# Patient Record
Sex: Female | Born: 1990 | Race: Black or African American | Hispanic: No | Marital: Single | Smoking: Never smoker
Health system: Southern US, Community
[De-identification: ages and names within clinical notes are randomized; demographics above are authoritative.]

## PROBLEM LIST (undated history)

## (undated) ENCOUNTER — Inpatient Hospital Stay (HOSPITAL_COMMUNITY): Payer: Self-pay

## (undated) DIAGNOSIS — J45909 Unspecified asthma, uncomplicated: Secondary | ICD-10-CM

## (undated) DIAGNOSIS — I10 Essential (primary) hypertension: Secondary | ICD-10-CM

## (undated) DIAGNOSIS — E7800 Pure hypercholesterolemia, unspecified: Secondary | ICD-10-CM

## (undated) DIAGNOSIS — O021 Missed abortion: Secondary | ICD-10-CM

## (undated) DIAGNOSIS — E119 Type 2 diabetes mellitus without complications: Secondary | ICD-10-CM

## (undated) DIAGNOSIS — E8881 Metabolic syndrome: Secondary | ICD-10-CM

## (undated) DIAGNOSIS — E282 Polycystic ovarian syndrome: Secondary | ICD-10-CM

## (undated) DIAGNOSIS — E88819 Insulin resistance, unspecified: Secondary | ICD-10-CM

## (undated) DIAGNOSIS — I82409 Acute embolism and thrombosis of unspecified deep veins of unspecified lower extremity: Secondary | ICD-10-CM

## (undated) DIAGNOSIS — I499 Cardiac arrhythmia, unspecified: Secondary | ICD-10-CM

## (undated) DIAGNOSIS — I2699 Other pulmonary embolism without acute cor pulmonale: Secondary | ICD-10-CM

## (undated) HISTORY — PX: NO PAST SURGERIES: SHX2092

## (undated) HISTORY — PX: WISDOM TOOTH EXTRACTION: SHX21

## (undated) HISTORY — DX: Metabolic syndrome: E88.81

## (undated) HISTORY — DX: Insulin resistance, unspecified: E88.819

## (undated) HISTORY — DX: Missed abortion: O02.1

## (undated) HISTORY — DX: Type 2 diabetes mellitus without complications: E11.9

## (undated) HISTORY — DX: Essential (primary) hypertension: I10

## (undated) HISTORY — DX: Unspecified asthma, uncomplicated: J45.909

## (undated) HISTORY — DX: Pure hypercholesterolemia, unspecified: E78.00

---
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
## 2010-04-13 ENCOUNTER — Emergency Department (HOSPITAL_COMMUNITY): Admission: EM | Admit: 2010-04-13 | Discharge: 2010-04-13 | Payer: Self-pay | Admitting: Emergency Medicine

## 2010-08-05 ENCOUNTER — Encounter: Payer: Self-pay | Admitting: "Endocrinology

## 2012-08-21 ENCOUNTER — Emergency Department
Admission: EM | Admit: 2012-08-21 | Discharge: 2012-08-21 | Disposition: A | Discharge status: Home / Self Care | Payer: BLUE CROSS/BLUE SHIELD | Attending: Emergency Medicine | Admitting: Emergency Medicine

## 2012-08-21 ENCOUNTER — Emergency Department: Payer: BLUE CROSS/BLUE SHIELD

## 2012-08-21 DIAGNOSIS — Y93K9 Activity, other involving animal care: Secondary | ICD-10-CM | POA: Insufficient documentation

## 2012-08-21 DIAGNOSIS — IMO0002 Reserved for concepts with insufficient information to code with codable children: Secondary | ICD-10-CM | POA: Insufficient documentation

## 2012-08-21 DIAGNOSIS — X500XXA Overexertion from strenuous movement or load, initial encounter: Secondary | ICD-10-CM | POA: Insufficient documentation

## 2012-08-21 MED ORDER — IBUPROFEN 400 MG PO TABS
800.0000 mg | ORAL_TABLET | Freq: Once | ORAL | Status: AC
Start: 2012-08-21 — End: 2012-08-21
  Administered 2012-08-21: 800 mg via ORAL
  Filled 2012-08-21: qty 2

## 2012-08-21 NOTE — ED Notes (Signed)
Reviewed discharge information with Erin Sweet LPN, discharge of patient delegated to her.

## 2012-08-21 NOTE — Discharge Instructions (Signed)
Use crutches and immobilizer until follow up with orthopedics.    Sprain, Knee    A sprain is an injury to the ligaments or capsule that holds a joint together. There are no broken bones. Most sprains take three to six weeks to heal. If the ligament is completely torn (severe sprain), it can take months to recover from.  Most knee sprains are treated with a splint, knee immobilizer or elastic wrap for support. Severe sprains may require surgery.  Home care  The following guidelines will help you care for your injury at home:  1. Stay off the injured leg as much as possible until you can walk on it without pain. If you have a lot of pain with walking, crutches or a walker may be prescribed. (These can be rented or purchased at [employer] and surgical or orthopedic supply stores). Follow your doctor's advice regarding when to begin bearing weight on that leg.  2. Keep your leg elevated to reduce pain and swelling. When sleeping, place a pillow under the injured leg. When sitting, support the injured leg so it is level with your waist. This is very important during the first 48 hours.  3. Apply an ice pack (ice cubes in a plastic bag, wrapped in a towel) over the injured area for 20 minutes every 1-2 hours the first day. You can place the ice pack directly over the splint. If a Velcro knee immobilizer was applied, you can open this to apply the ice pack directly to the knee. Continue with ice packs 3-4 times a day for the next two days, then as needed for the relief of pain and swelling.  4. You may use acetaminophen or ibuprofen to control pain, unless another pain medicine was prescribed. If you have chronic liver or kidney disease or ever had a stomach ulcer or GI bleeding, talk with your doctor before using these medicines.  5. If you were given a splint, keep it completely dry at all times. Bathe with your splint out of the water, protected with a large plastic bag, rubber-banded at the top end. If a  fiberglass splint gets wet, you can dry it with a hair-dryer. If you have a Velcro knee immobilizer, you can remove this to bathe, unless told otherwise.  Follow-up care  Follow up with your doctor as advised.  Any X-rays you had today don't show any broken bones, breaks, or fractures. Sometimes fractures don't show up on the first X-ray. Bruises and sprains can sometimes hurt as much as a fracture. These injuries can take time to heal completely. If your symptoms don't improve or they get worse, talk with your doctor. You may need a repeat X-ray.  When to seek medical care  Get prompt medical attention if any of the following occur:   The plaster cast or splint becomes wet or soft   The fiberglass cast or splint remains wet for more than 24 hours   Pain or swelling increases   Toes become cold, blue, numb or tingly   [address] [place], [place] [address]. All rights reserved. This information is not intended as a substitute for professional medical care. Always follow your healthcare professional's instructions.

## 2012-08-21 NOTE — ED Notes (Signed)
Twisted left knee.

## 2012-08-22 NOTE — ED Provider Notes (Signed)
Physician/Midlevel provider first contact with patient: 08/21/12 0901         History     Chief Complaint   Patient presents with   . left knee injury      Patient is a 22 y.o. female presenting with knee pain. The history is provided by the patient.   Knee Pain  This is a new problem. The current episode started today. The problem occurs constantly. The problem has been unchanged. Associated symptoms include arthralgias. Pertinent negatives include no abdominal pain, chest pain, chills, fever, headaches, joint swelling, myalgias, nausea, neck pain, numbness, rash, vomiting or weakness. The symptoms are aggravated by walking. She has tried nothing for the symptoms.   Twisted L knee while working with horse.  Pain with wt bearing and walking on lateral knee.  Denies other pain or injury.    Past Medical History   Diagnosis Date   . Asthma without status asthmaticus        History reviewed. No pertinent past surgical history.    Family History   Problem Relation Age of Onset   . Hyperlipidemia Mother    . Diabetes Maternal Aunt        Social  History   Substance Use Topics   . Smoking status: Never Smoker    . Smokeless tobacco: Not on file   . Alcohol Use: No       .     No Known Allergies    Current/Home Medications    No medications on file        Review of Systems   Constitutional: Negative for fever and chills.   HENT: Negative for neck pain.    Respiratory: Negative for chest tightness and shortness of breath.    Cardiovascular: Negative for chest pain.   Gastrointestinal: Negative for nausea, vomiting and abdominal pain.   Genitourinary: Negative for flank pain.   Musculoskeletal: Positive for arthralgias and gait problem. Negative for myalgias, back pain and joint swelling.   Skin: Negative for color change, pallor, rash and wound.   Neurological: Negative for weakness, numbness and headaches.   Psychiatric/Behavioral: Negative for confusion and self-injury.       Physical Exam    BP 134/81  Pulse 91  Temp  98.4 F (36.9 [name])  Resp 18  Ht 1.803 m  Wt 131.543 kg  BMI 40.46 kg/m2  LMP 08/03/2012    Physical Exam   Nursing note and vitals reviewed.  Constitutional: She is oriented to person, place, and time. She appears well-developed and well-nourished. No distress.   Neck: Normal range of motion. Neck supple.   Cardiovascular: Normal rate and regular rhythm.    Pulmonary/Chest: Effort normal. No respiratory distress.   Musculoskeletal:        Left knee: She exhibits no swelling, no effusion, no deformity, no erythema, normal alignment and normal patellar mobility. tenderness found. Lateral joint line tenderness noted.        Pain with wt bearing, unable to take two steps, full passive ROM, +McMurray varus stress   Neurological: She is alert and oriented to person, place, and time. She exhibits normal muscle tone.   Skin: Skin is warm and dry. No rash noted.   Psychiatric: She has a normal mood and affect. Her behavior is normal. Thought content normal.       MDM and ED Course     ED Medication Orders      Start     Status Ordering Provider  08/21/12 0945   ibuprofen (ADVIL,MOTRIN) tablet 800 mg   Once      Route: Oral  Ordered Dose: 800 mg         Last MAR action:  Given Judith [name]                 MDM  Number of Diagnoses or Management Options  Sprain of left knee:   Diagnosis management comments: I, Jolee Critcher [name]. Kizzie Bane, MD, have been the primary provider for Judith [name] during this Emergency Dept visit.    Knee strain, difficulty with wt bearing.  Knee immobilizer/crutches and f/u ortho for further eval.         Amount and/or Complexity of Data Reviewed  Tests in the radiology section of CPT: ordered and reviewed          Procedures    Clinical Impression & Disposition     Clinical Impression  Final diagnoses:   Sprain of left knee        ED Disposition     Discharge Judith [name] discharge to home/self care.    Condition at discharge: Good             New Prescriptions    No medications on file                Erling Conte, MD  08/22/12 1131

## 2013-08-01 ENCOUNTER — Emergency Department: Payer: BLUE CROSS/BLUE SHIELD

## 2013-08-01 ENCOUNTER — Emergency Department
Admission: EM | Admit: 2013-08-01 | Discharge: 2013-08-01 | Disposition: A | Discharge status: Home / Self Care | Payer: BLUE CROSS/BLUE SHIELD | Attending: Emergency Medicine | Admitting: Emergency Medicine

## 2013-08-01 DIAGNOSIS — S161XXA Strain of muscle, fascia and tendon at neck level, initial encounter: Secondary | ICD-10-CM

## 2013-08-01 DIAGNOSIS — IMO0001 Reserved for inherently not codable concepts without codable children: Secondary | ICD-10-CM

## 2013-08-01 DIAGNOSIS — S0990XA Unspecified injury of head, initial encounter: Secondary | ICD-10-CM | POA: Insufficient documentation

## 2013-08-01 DIAGNOSIS — S139XXA Sprain of joints and ligaments of unspecified parts of neck, initial encounter: Secondary | ICD-10-CM | POA: Insufficient documentation

## 2013-08-01 DIAGNOSIS — I10 Essential (primary) hypertension: Secondary | ICD-10-CM | POA: Insufficient documentation

## 2013-08-01 MED ORDER — ONDANSETRON 4 MG PO TBDP
4.0000 mg | ORAL_TABLET | Freq: Once | ORAL | Status: AC
Start: 2013-08-01 — End: 2013-08-01
  Administered 2013-08-01: 4 mg via ORAL
  Filled 2013-08-01: qty 1

## 2013-08-01 MED ORDER — ACETAMINOPHEN 500 MG PO TABS
1000.0000 mg | ORAL_TABLET | Freq: Once | ORAL | Status: AC
Start: 2013-08-01 — End: 2013-08-01
  Administered 2013-08-01: 1000 mg via ORAL
  Filled 2013-08-01: qty 2

## 2013-08-01 NOTE — ED Provider Notes (Signed)
Physician/Midlevel provider first contact with patient: 08/01/13 1759         History     Chief Complaint   Patient presents with   . Head Injury     HPI Comments: 23 yo female with c/o head injury at 11 am today. Larey Seat off a horse and landed on her right shoulder and then her head hit the ground. Was wearing a helmet. No loc. Got back up and on the horse and continued to ride. Went home and took a nap and when she woke up this evening c/o h/a, nausea and right sided neck pain. Denies dizziness, vision changes, back pain, vomiting or unsteady gait.    Patient is a 23 y.o. female presenting with head injury. The history is provided by the patient.   Head Injury   The incident occurred 6 to 12 hours ago. She came to the ER via walk-in. The injury mechanism was a fall. There was no loss of consciousness. Pertinent negatives include no blurred vision, no vomiting and no weakness. She has tried nothing for the symptoms.       Past Medical History   Diagnosis Date   . Asthma without status asthmaticus    . Hypertension    . Hypercholesteremia        History reviewed. No pertinent past surgical history.    Family History   Problem Relation Age of Onset   . Hyperlipidemia Mother    . Diabetes Maternal Aunt        Social  History   Substance Use Topics   . Smoking status: Never Smoker    . Smokeless tobacco: Not on file   . Alcohol Use: Yes      Comment: social       .     No Known Allergies    Current/Home Medications    No medications on file        Review of Systems   Constitutional: Negative for fever.   Eyes: Negative for blurred vision, discharge, redness and visual disturbance.   Respiratory: Negative for cough and shortness of breath.    Cardiovascular: Negative for chest pain.   Gastrointestinal: Positive for nausea. Negative for vomiting.   Genitourinary: Negative for flank pain.   Musculoskeletal: Positive for neck pain. Negative for back pain.   Skin: Negative for wound.   Neurological: Positive for headaches.  Negative for dizziness, syncope, weakness and light-headedness.   Psychiatric/Behavioral: Negative for confusion.       Physical Exam    BP: 181/98 mmHg, Heart Rate: 88 , Temp: 97.4 F (36.3 C), Resp Rate: 18 , SpO2: 100 %, Weight: 131.543 kg    Physical Exam   Nursing note and vitals reviewed.  Constitutional: She is oriented to person, place, and time. She appears well-developed and well-nourished.   HENT:   Head: Normocephalic and atraumatic.   Right Ear: External ear normal.   Left Ear: External ear normal.   Mouth/Throat: Oropharynx is clear and moist.   Eyes: Conjunctivae normal and EOM are normal. Pupils are equal, round, and reactive to light. Right eye exhibits no discharge. Left eye exhibits no discharge.   Neck: Normal range of motion. Neck supple.        Right cervical paraspinal muscles ttp, no midline ttp, full rom   Cardiovascular: Normal rate and regular rhythm.    Pulmonary/Chest: Effort normal and breath sounds normal. No respiratory distress.   Musculoskeletal: Normal range of motion.  No t-l spine ttp   Neurological: She is alert and oriented to person, place, and time. She has normal strength. She displays a negative Romberg sign. Coordination and gait normal. GCS eye subscore is 4. GCS verbal subscore is 5. GCS motor subscore is 6.   Skin: Skin is warm and dry.   Psychiatric: She has a normal mood and affect. Her behavior is normal.       MDM and ED Course     ED Medication Orders     None           MDM  Number of Diagnoses or Management Options  Cervical strain, initial encounter:   Elevated blood pressure:   Head injury, initial encounter:   Diagnosis management comments: I, Polo Riley PA-C, have been the primary provider for Judith [name] during this Emergency Dept visit.  Oxygen saturation by pulse oximetry is 95%-100%, Normal.  Interventions: None Needed    No loc, no vomiting, no dizziness, no unsteady gait, right sided neck pain, no midline ttp, imaging not warrented. Pt in  agreement.     Recommended immediate er return for any new or worsening problems or concerns. Pt expressed understanding and agreement with [facility] plan. Well appearing upon [place].     The attending signature signifies review and agreement of the history, physical examination, evaluation, clinical impression and plan except as noted  Case d/w Dr.Thomas who is in agreement with plan        Procedures    Clinical Impression & Disposition     Clinical Impression  Final diagnoses:   None        ED Disposition     None           New Prescriptions    No medications on file                 Rondell Reams, [place]  08/01/13 1842

## 2013-08-01 NOTE — ED Notes (Signed)
While on a horse today (wearing a helmet) patient fell off- landing on back and hsoulder then hitting head- no -LOC.jpatient c/o head and neck pain    Patient non complaint with medication for BP and Chol

## 2013-08-01 NOTE — Discharge Instructions (Signed)
Return to the ER immediately for any new or worsening problems or concerns  Have your blood pressure rechecked by your primary care provider in 1-2 weeks      Elevated Blood Pressure    During your visit today your blood pressure was higher than normal.    Check your blood pressure several times over the next several days, then follow up with your regular doctor. If you do not have a doctor, ask the medical staff to refer you to one.    You may need medication for your blood pressure if it stays high. Untreated high blood pressure can cause damage to your heart and kidneys and may lead to a heart attack or stroke. It is VERY IMPORTANT to follow up with your doctor.   Check your blood pressure daily and follow up with your physician.   A doctor will diagnose high blood pressure only if your blood pressure is high for several days. Many pharmacies have machines that let you check your own blood pressure. You can also check with a fire station to see whether a paramedic will take your blood pressure. Another option is to purchase a blood pressure monitor to use at home. These are available at most pharmacies.     YOU SHOULD SEEK MEDICAL ATTENTION IMMEDIATELY, EITHER HERE OR AT THE NEAREST EMERGENCY DEPARTMENT, IF ANY OF THE FOLLOWING OCCURS:   You have a sudden or severe headache.   You are numb, tingly, or weak on one side of your body, half of your face droops, or you have trouble speaking.   You have chest pain.   You are short of breath.      Head Injury, NOS    You have been seen for a head injury.    A head injury can happen after something strikes the head or as a result of a fall or other injury. Head injuries can range from mild injuries to more severe injuries. The more severe injuries can result in broken bones or injury to the brain itself. Mild head injuries will show no abnormalities if a CT (CAT) scan of the brain is done.     Although you had an injury to your head, you do not seem to have  a serious brain injury.     Head injury symptoms can last from hours to months. The time depends on how bad the injury was. It also depends on whether you ve had a concussion in the past. Some problems with a concussion can include: Sleep, memory and concentration problems. They also include chronic (ongoing) headaches and sensitivity to light. These symptoms can happen soon after the concussion. They can also develop slowly over time. They can last up to a year. When this happens, it is called "post concussion syndrome."    If you develop "post-concussive syndrome," you should follow up with your doctor. Your doctor can care for you or provide a referral to a head-injury specialist.    Treatment includes observation at home and pain medicine like acetaminophen (Tylenol) or ibuprofen (Advil or Motrin). Prescription pain medicine is probably not needed.    You might have a mild headache for a few days.    Over the next 24 hours:   Stay with family or friends who can watch your behavior.   Avoid alcohol or drugs.    YOU SHOULD SEEK MEDICAL ATTENTION IMMEDIATELY, EITHER HERE OR AT THE NEAREST EMERGENCY DEPARTMENT, IF ANY OF THE FOLLOWING OCCURS:   Your headache  gets worse.   Your headache pain changes.   You have a fever, neck pain, vision changes, difficulty walking or change of behavior.   You feel numbness, tingling, weakness in your arms or legs.   You faint.   Your vision changes.   You vomit often or cannot keep medicine down.   You are confused or have difficulty waking from sleep.    Cervical Strain    You have been diagnosed with a neck strain, also called a cervical strain.    The cervical spine is between the base of the skull and the top of the shoulders.    A strain happens when a muscle is stretched, torn or injured. The pain that you feel is caused by inflammation (swelling) or bruising in the muscle. A strain is not the same as a sprain. A sprain is an injury to a ligament that  holds bones together.    A cervical strain occurs when the head snaps forward during an accident or a fall. The muscles can easily be strained with this type of movement. It is normal to experience pain over the muscles around the neck but not over the bones of the cervical spine.    Your doctor did not find any pain over the bones in your neck (even though you might have pain in the neck muscles). This means it is very unlikely that you have a fracture in your neck. Your doctor did not think it was necessary to take an x-ray.    Apply a warm damp washcloth to the neck for 20 minutes at a time, at least 4 times per day. This will reduce your pain. Massaging your neck might also help.    It is normal to feel stiffness and pain in your neck after a strain. This pain may last for the next few days. If your pain stays about the same or gets better, you probably do not need to see a doctor. However, if your symptoms get worse or you have new symptoms, you should return here or go to the nearest Emergency Department.    Call your physician or go to the nearest Emergency Department if you your pain does not improve within 4 weeks or your pain is bad enough to seriously limit your normal activities.    YOU SHOULD SEEK MEDICAL ATTENTION IMMEDIATELY, EITHER HERE OR AT THE NEAREST EMERGENCY DEPARTMENT, IF ANY OF THE FOLLOWING OCCURS:   Your arms and legs tingle or get numb (lose feeling).   Your arms or legs are weak.   You feel that your neck is unstable.   You lose control of your bladder or bowels. If this were to happen, it may cause you to wet or soil yourself. Some people may actually have problems urinating instead.   Your pain gets worse.

## 2013-08-01 NOTE — ED Provider Notes (Signed)
I, Oliver Barre, have personally seen and examined this patient, and have fully participated in her care.  I agree with all pertinent and available clinical information, including history, physical examination, assessment, clinical impression, plan as documented by the [facility] except as noted.      Pt in NO distress, highly doubt significan head injury, no need felt for imaging.  Head injury precautions d/w the pt    Oliver Barre, MD  08/01/13 1850

## 2014-08-24 ENCOUNTER — Ambulatory Visit: Payer: Self-pay | Admitting: Women's Health

## 2014-09-07 ENCOUNTER — Encounter: Payer: Self-pay | Admitting: Women's Health

## 2014-09-07 ENCOUNTER — Other Ambulatory Visit (HOSPITAL_COMMUNITY)
Admission: RE | Admit: 2014-09-07 | Discharge: 2014-09-07 | Disposition: A | Discharge status: Home / Self Care | Payer: Federal, State, Local not specified - PPO | Source: Ambulatory Visit | Attending: Gynecology | Admitting: Gynecology

## 2014-09-07 ENCOUNTER — Ambulatory Visit (INDEPENDENT_AMBULATORY_CARE_PROVIDER_SITE_OTHER): Payer: Federal, State, Local not specified - PPO | Admitting: Women's Health

## 2014-09-07 VITALS — BP 126/80 | Ht 71.0 in | Wt 286.0 lb

## 2014-09-07 DIAGNOSIS — Z01411 Encounter for gynecological examination (general) (routine) with abnormal findings: Secondary | ICD-10-CM | POA: Insufficient documentation

## 2014-09-07 DIAGNOSIS — N926 Irregular menstruation, unspecified: Secondary | ICD-10-CM | POA: Insufficient documentation

## 2014-09-07 DIAGNOSIS — Z113 Encounter for screening for infections with a predominantly sexual mode of transmission: Secondary | ICD-10-CM

## 2014-09-07 DIAGNOSIS — Z01419 Encounter for gynecological examination (general) (routine) without abnormal findings: Secondary | ICD-10-CM

## 2014-09-07 LAB — CBC WITH DIFFERENTIAL/PLATELET
Basophils Absolute: 0.1 10*3/uL (ref 0.0–0.1)
Basophils Relative: 1 % (ref 0–1)
Eosinophils Absolute: 0.2 10*3/uL (ref 0.0–0.7)
Eosinophils Relative: 3 % (ref 0–5)
HCT: 39.2 % (ref 36.0–46.0)
Hemoglobin: 12.9 g/dL (ref 12.0–15.0)
Lymphocytes Relative: 28 % (ref 12–46)
Lymphs Abs: 2.2 10*3/uL (ref 0.7–4.0)
MCH: 27.3 pg (ref 26.0–34.0)
MCHC: 32.9 g/dL (ref 30.0–36.0)
MCV: 82.9 fL (ref 78.0–100.0)
MPV: 9 fL (ref 8.6–12.4)
Monocytes Absolute: 0.6 10*3/uL (ref 0.1–1.0)
Monocytes Relative: 7 % (ref 3–12)
Neutro Abs: 4.9 10*3/uL (ref 1.7–7.7)
Neutrophils Relative %: 61 % (ref 43–77)
Platelets: 426 10*3/uL — ABNORMAL HIGH (ref 150–400)
RBC: 4.73 MIL/uL (ref 3.87–5.11)
RDW: 14.1 % (ref 11.5–15.5)
WBC: 8 10*3/uL (ref 4.0–10.5)

## 2014-09-07 LAB — COMPREHENSIVE METABOLIC PANEL
ALT: 20 U/L (ref 0–35)
AST: 16 U/L (ref 0–37)
Albumin: 4 g/dL (ref 3.5–5.2)
Alkaline Phosphatase: 60 U/L (ref 39–117)
BUN: 14 mg/dL (ref 6–23)
CHLORIDE: 103 meq/L (ref 96–112)
CO2: 25 mEq/L (ref 19–32)
CREATININE: 0.8 mg/dL (ref 0.50–1.10)
Calcium: 8.9 mg/dL (ref 8.4–10.5)
Glucose, Bld: 76 mg/dL (ref 70–99)
POTASSIUM: 4.1 meq/L (ref 3.5–5.3)
SODIUM: 137 meq/L (ref 135–145)
TOTAL PROTEIN: 6.9 g/dL (ref 6.0–8.3)
Total Bilirubin: 0.3 mg/dL (ref 0.2–1.2)

## 2014-09-07 LAB — TSH: TSH: 0.939 u[IU]/mL (ref 0.350–4.500)

## 2014-09-07 MED ORDER — SPIRONOLACTONE 25 MG PO TABS: 25.0000 mg | ORAL_TABLET | Freq: Every day | ORAL | Status: DC

## 2014-09-07 NOTE — Progress Notes (Signed)
Ricka BurdockDanielle N Zentner Mar 26, 1991 [id number]    History:    Presents for annual exam.  Cycles started at age 24-irregular every 2-3 months, currently monthly. Reports cycles more regular with weight loss. Sexually active 1 with a condom. Did not receive gardasil, has not had a Pap. 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  Past medical history, past surgical history, family history and social history were all reviewed and documented in the EPIC chart. 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  ROS:  A ROS was performed and pertinent positives and negatives are included.  Exam:  Filed Vitals:   09/07/14 1423  BP: 126/80    General appearance:  Normal Thyroid:  Symmetrical, normal in size, without palpable masses or nodularity. Respiratory  Auscultation:  Clear without wheezing or rhonchi Cardiovascular  Auscultation:  Regular rate, without rubs, murmurs or gallops  Edema/varicosities:  Not grossly evident Abdominal  Soft,nontender, without masses, guarding or rebound.  Liver/spleen:  No organomegaly noted  Hernia:  None appreciated  Skin  Inspection:  Grossly normal   Breasts: Examined lying and sitting.     Right: Without masses, retractions, discharge or axillary adenopathy.     Left: Without masses, retractions, discharge or axillary adenopathy. Gentitourinary   Inguinal/mons:  Normal without inguinal adenopathy  External genitalia:  Normal  BUS/Urethra/Skene's glands:  Normal  Vagina:  Normal  Cervix:  Normal  Uterus: normal in size, shape and contour.  Midline and mobile  Adnexa/parametria:     Rt: Without masses or tenderness.   Lt: Without masses or tenderness.  Anus and perineum: Normal    Assessment/Plan:  24 y.o. SBF G0 for annual exam.    Irregular cycles/hirsutism/obesity Questionable PCO S Obesity  Plan: Ultrasound after next cycle, SBE's, increase exercise and decrease calories for weight loss. MVI daily  encouraged. Options reviewed, declines menstrual regulation at this time. Spironolactone 25 mg daily by mouth prescription, proper use given and reviewed. Condoms encouraged if sexually active. CBC, TSH, prolactin, testosterone, UA, Pap. GC/Chlamydia, declines need for HIV, hepatitis or RPR.    Harrington ChallengerYOUNG,Aeryn Medici J [facility] PscWHNP, 4:37 PM 09/07/2014

## 2014-09-07 NOTE — Patient Instructions (Signed)
Polycystic Ovarian Syndrome Polycystic ovarian syndrome (PCOS) is a common hormonal disorder among women of reproductive age. Most women with PCOS grow many small cysts on their ovaries. PCOS can cause problems with your periods and make it difficult to get pregnant. It can also cause an increased risk of miscarriage with pregnancy. If left untreated, PCOS can lead to serious health problems, such as diabetes and heart disease. CAUSES The cause of PCOS is not fully understood, but genetics may be a factor. SIGNS AND SYMPTOMS   Infrequent or no menstrual periods.   Inability to get pregnant (infertility) because of not ovulating.   Increased growth of hair on the face, chest, stomach, back, thumbs, thighs, or toes.   Acne, oily skin, or dandruff.   Pelvic pain.   Weight gain or obesity, usually carrying extra weight around the waist.   Type 2 diabetes.   High cholesterol.   High blood pressure.   Female-pattern baldness or thinning hair.   Patches of thickened and dark brown or black skin on the neck, arms, breasts, or thighs.   Tiny excess flaps of skin (skin tags) in the armpits or neck area.   Excessive snoring and having breathing stop at times while asleep (sleep apnea).   Deepening of the voice.   Gestational diabetes when pregnant.  DIAGNOSIS  There is no single test to diagnose PCOS.   Your health care provider will:   Take a medical history.   Perform a pelvic exam.   Have ultrasonography done.   Check your female and female hormone levels.   Measure glucose or sugar levels in the blood.   Do other blood tests.   If you are producing too many female hormones, your health care provider will make sure it is from PCOS. At the physical exam, your health care provider will want to evaluate the areas of increased hair growth. Try to allow natural hair growth for a few days before the visit.   During a pelvic exam, the ovaries may be enlarged  or swollen because of the increased number of small cysts. This can be seen more easily by using vaginal ultrasonography or screening to examine the ovaries and lining of the uterus (endometrium) for cysts. The uterine lining may become thicker if you have not been having a regular period.  TREATMENT  Because there is no cure for PCOS, it needs to be managed to prevent problems. Treatments are based on your symptoms. Treatment is also based on whether you want to have a baby or whether you need contraception.  Treatment may include:   Progesterone hormone to start a menstrual period.   Birth control pills to make you have regular menstrual periods.   Medicines to make you ovulate, if you want to get pregnant.   Medicines to control your insulin.   Medicine to control your blood pressure.   Medicine and diet to control your high cholesterol and triglycerides in your blood.  Medicine to reduce excessive hair growth.  Surgery, making small holes in the ovary, to decrease the amount of female hormone production. This is done through a long, lighted tube (laparoscope) placed into the pelvis through a tiny incision in the lower abdomen.  HOME CARE INSTRUCTIONS  Only take over-the-counter or prescription medicine as directed by your health care provider.  Pay attention to the foods you eat and your activity levels. This can help reduce the effects of PCOS.  Keep your weight under control.  Eat foods that are  low in carbohydrate and high in fiber.  Exercise regularly. SEEK MEDICAL CARE IF:  Your symptoms do not get better with medicine.  You have new symptoms. Document Released: 10/25/2004 Document Revised: 04/21/2013 Document Reviewed: 12/17/2012 [facility] Patient Information 2015 [place], [place]. This information is not intended to replace advice given to you by your health care provider. Make sure you discuss any questions you have with your health care provider. Exercise to  Stay Healthy Exercise helps you become and stay healthy. EXERCISE IDEAS AND TIPS Choose exercises that:  You enjoy.  Fit into your day. You do not need to exercise really hard to be healthy. You can do exercises at a slow or medium level and stay healthy. You can:  Stretch before and after working out.  Try yoga, Pilates, or tai chi.  Lift weights.  Walk fast, swim, jog, run, climb stairs, bicycle, dance, or rollerskate.  Take aerobic classes. Exercises that burn about 150 calories:  Running 1  miles in 15 minutes.  Playing volleyball for 45 to 60 minutes.  Washing and waxing a car for 45 to 60 minutes.  Playing touch football for 45 minutes.  Walking 1  miles in 35 minutes.  Pushing a stroller 1  miles in 30 minutes.  Playing basketball for 30 minutes.  Raking leaves for 30 minutes.  Bicycling 5 miles in 30 minutes.  Walking 2 miles in 30 minutes.  Dancing for 30 minutes.  Shoveling snow for 15 minutes.  Swimming laps for 20 minutes.  Walking up stairs for 15 minutes.  Bicycling 4 miles in 15 minutes.  Gardening for 30 to 45 minutes.  Jumping rope for 15 minutes.  Washing windows or floors for 45 to 60 minutes. Document Released: 08/03/2010 Document Revised: 09/23/2011 Document Reviewed: 08/03/2010 [facility] Patient Information 2015 [place], [place]. This information is not intended to replace advice given to you by your health care provider. Make sure you discuss any questions you have with your health care provider. Polycystic Ovarian Syndrome Polycystic ovarian syndrome (PCOS) is a common hormonal disorder among women of reproductive age. Most women with PCOS grow many small cysts on their ovaries. PCOS can cause problems with your periods and make it difficult to get pregnant. It can also cause an increased risk of miscarriage with pregnancy. If left untreated, PCOS can lead to serious health problems, such as diabetes and heart disease. CAUSES The  cause of PCOS is not fully understood, but genetics may be a factor. SIGNS AND SYMPTOMS   Infrequent or no menstrual periods.   Inability to get pregnant (infertility) because of not ovulating.   Increased growth of hair on the face, chest, stomach, back, thumbs, thighs, or toes.   Acne, oily skin, or dandruff.   Pelvic pain.   Weight gain or obesity, usually carrying extra weight around the waist.   Type 2 diabetes.   High cholesterol.   High blood pressure.   Female-pattern baldness or thinning hair.   Patches of thickened and dark brown or black skin on the neck, arms, breasts, or thighs.   Tiny excess flaps of skin (skin tags) in the armpits or neck area.   Excessive snoring and having breathing stop at times while asleep (sleep apnea).   Deepening of the voice.   Gestational diabetes when pregnant.  DIAGNOSIS  There is no single test to diagnose PCOS.   Your health care provider will:   Take a medical history.   Perform a pelvic exam.   Have ultrasonography  done.   Check your female and female hormone levels.   Measure glucose or sugar levels in the blood.   Do other blood tests.   If you are producing too many female hormones, your health care provider will make sure it is from PCOS. At the physical exam, your health care provider will want to evaluate the areas of increased hair growth. Try to allow natural hair growth for a few days before the visit.   During a pelvic exam, the ovaries may be enlarged or swollen because of the increased number of small cysts. This can be seen more easily by using vaginal ultrasonography or screening to examine the ovaries and lining of the uterus (endometrium) for cysts. The uterine lining may become thicker if you have not been having a regular period.  TREATMENT  Because there is no cure for PCOS, it needs to be managed to prevent problems. Treatments are based on your symptoms. Treatment is also  based on whether you want to have a baby or whether you need contraception.  Treatment may include:   Progesterone hormone to start a menstrual period.   Birth control pills to make you have regular menstrual periods.   Medicines to make you ovulate, if you want to get pregnant.   Medicines to control your insulin.   Medicine to control your blood pressure.   Medicine and diet to control your high cholesterol and triglycerides in your blood.  Medicine to reduce excessive hair growth.  Surgery, making small holes in the ovary, to decrease the amount of female hormone production. This is done through a long, lighted tube (laparoscope) placed into the pelvis through a tiny incision in the lower abdomen.  HOME CARE INSTRUCTIONS  Only take over-the-counter or prescription medicine as directed by your health care provider.  Pay attention to the foods you eat and your activity levels. This can help reduce the effects of PCOS.  Keep your weight under control.  Eat foods that are low in carbohydrate and high in fiber.  Exercise regularly. SEEK MEDICAL CARE IF:  Your symptoms do not get better with medicine.  You have new symptoms. Document Released: 10/25/2004 Document Revised: 04/21/2013 Document Reviewed: 12/17/2012 [facility] Patient Information 2015 [place], [place]. This information is not intended to replace advice given to you by your health care provider. Make sure you discuss any questions you have with your health care provider.

## 2014-09-08 LAB — PROLACTIN: Prolactin: 16.7 ng/mL

## 2014-09-08 LAB — TESTOSTERONE: Testosterone: 72 ng/dL — ABNORMAL HIGH (ref 10–70)

## 2014-09-09 LAB — GC/CHLAMYDIA PROBE AMP
CT Probe RNA: NEGATIVE
GC Probe RNA: NEGATIVE

## 2014-09-09 LAB — CYTOLOGY - PAP

## 2014-09-12 ENCOUNTER — Other Ambulatory Visit: Payer: Self-pay | Admitting: Women's Health

## 2014-09-12 DIAGNOSIS — R7989 Other specified abnormal findings of blood chemistry: Secondary | ICD-10-CM

## 2014-09-12 MED ORDER — DESOGESTREL-ETHINYL ESTRADIOL 0.15-0.02/0.01 MG (21/5) PO TABS: 1.0000 | ORAL_TABLET | Freq: Every day | ORAL | Status: DC

## 2014-09-20 ENCOUNTER — Ambulatory Visit (INDEPENDENT_AMBULATORY_CARE_PROVIDER_SITE_OTHER): Payer: Federal, State, Local not specified - PPO | Admitting: Gynecology

## 2014-09-20 ENCOUNTER — Encounter: Payer: Self-pay | Admitting: Gynecology

## 2014-09-20 VITALS — BP 134/86

## 2014-09-20 DIAGNOSIS — Z7185 Encounter for immunization safety counseling: Secondary | ICD-10-CM

## 2014-09-20 DIAGNOSIS — Z7189 Other specified counseling: Secondary | ICD-10-CM

## 2014-09-20 DIAGNOSIS — Z23 Encounter for immunization: Secondary | ICD-10-CM

## 2014-09-20 DIAGNOSIS — R896 Abnormal cytological findings in specimens from other organs, systems and tissues: Secondary | ICD-10-CM | POA: Diagnosis not present

## 2014-09-20 DIAGNOSIS — IMO0002 Reserved for concepts with insufficient information to code with codable children: Secondary | ICD-10-CM

## 2014-09-20 DIAGNOSIS — N870 Mild cervical dysplasia: Secondary | ICD-10-CM | POA: Insufficient documentation

## 2014-09-20 NOTE — Patient Instructions (Signed)
HPV Vaccine Gardasil (Human Papillomavirus): What You Need to Know 1. What is HPV? Genital human papillomavirus (HPV) is the most common sexually transmitted virus in the [place]. More than half of sexually active men and women are infected with HPV at some time in their lives. About 20 million Americans are currently infected, and about 6 million more get infected each year. HPV is usually spread through sexual contact. Most HPV infections don't cause any symptoms, and go away on their own. But HPV can cause cervical cancer in women. Cervical cancer is the 2nd leading cause of cancer deaths among women around the world. In the [place], about 12,000 women get cervical cancer every year and about 4,000 are expected to die from it. HPV is also associated with several less common cancers, such as vaginal and vulvar cancers in women, and anal and oropharyngeal (back of the throat, including base of tongue and tonsils) cancers in both men and women. HPV can also cause genital warts and warts in the throat. There is no cure for HPV infection, but some of the problems it causes can be treated. 2. HPV vaccine: Why get vaccinated? The HPV vaccine you are getting is one of two vaccines that can be given to prevent HPV. It may be given to both males and females.  This vaccine can prevent most cases of cervical cancer in females, if it is given before exposure to the virus. In addition, it can prevent vaginal and vulvar cancer in females, and genital warts and anal cancer in both males and females. Protection from HPV vaccine is expected to be long-lasting. But vaccination is not a substitute for cervical cancer screening. Women should still get regular Pap tests. 3. Who should get this HPV vaccine and when? HPV vaccine is given as a 3-dose series  1st Dose: Now  2nd Dose: 1 to 2 months after Dose 1  3rd Dose: 6 months after Dose 1 Additional (booster) doses are not recommended. Routine  vaccination  This HPV vaccine is recommended for girls and boys 11 or 26 years of age. It may be given starting at age 9. Why is HPV vaccine recommended at 11 or 26 years of age?  HPV infection is easily acquired, even with only one sex partner. That is why it is important to get HPV vaccine before any sexual contact takes place. Also, response to the vaccine is better at this age than at older ages. Catch-up vaccination This vaccine is recommended for the following people who have not completed the 3-dose series:   Females 13 through 26 years of age.  Males 13 through 26 years of age. This vaccine may be given to men 22 through 26 years of age who have not completed the 3-dose series. It is recommended for men through age 26 who have sex with men or whose immune system is weakened because of HIV infection, other illness, or medications.  HPV vaccine may be given at the same time as other vaccines. 4. Some people should not get HPV vaccine or should wait.  Anyone who has ever had a life-threatening allergic reaction to any component of HPV vaccine, or to a previous dose of HPV vaccine, should not get the vaccine. Tell your doctor if the person getting vaccinated has any severe allergies, including an allergy to yeast.  HPV vaccine is not recommended for pregnant women. However, receiving HPV vaccine when pregnant is not a reason to consider terminating the pregnancy. Women who are breast   feeding may get the vaccine.  People who are mildly ill when a dose of HPV is planned can still be vaccinated. People with a moderate or severe illness should wait until they are better. 5. What are the risks from this vaccine? This HPV vaccine has been used in the [place] and around the world for about six years and has been very safe. However, any medicine could possibly cause a serious problem, such as a severe allergic reaction. The risk of any vaccine causing a serious injury, or death, is extremely  small. Life-threatening allergic reactions from vaccines are very rare. If they do occur, it would be within a few minutes to a few hours after the vaccination. Several mild to moderate problems are known to occur with this HPV vaccine. These do not last long and go away on their own.  Reactions in the arm where the shot was given:  Pain (about 8 people in 10)  Redness or swelling (about 1 person in 4)  Fever:  Mild (100 F) (about 1 person in 10)  Moderate (102 F) (about 1 person in 85)  Other problems:  Headache (about 1 person in 3)  Fainting: Brief fainting spells and related symptoms (such as jerking movements) can happen after any medical procedure, including vaccination. Sitting or lying down for about 15 minutes after a vaccination can help prevent fainting and injuries caused by falls. Tell your doctor if the patient feels dizzy or light-headed, or has vision changes or ringing in the ears.  Like all vaccines, HPV vaccines will continue to be monitored for unusual or severe problems. 6. What if there is a serious reaction? What should I look for?  Look for anything that concerns you, such as signs of a severe allergic reaction, very high fever, or behavior changes. Signs of a severe allergic reaction can include hives, swelling of the face and throat, difficulty breathing, a fast heartbeat, dizziness, and weakness. These would start a few minutes to a few hours after the vaccination.  What should I do?  If you think it is a severe allergic reaction or other emergency that can't wait, call 9-1-1 or get the person to the nearest hospital. Otherwise, call your doctor.  Afterward, the reaction should be reported to the Vaccine Adverse Event Reporting System (VAERS). Your doctor might file this report, or you can do it yourself through the VAERS web site at www.vaers.LAgents.no, or by calling 1-(630) 337-2153. VAERS is only for reporting reactions. They do not give medical  advice. 7. The National Vaccine Injury Compensation Program  The [employer] Vaccine Injury Compensation Program (VICP) is a federal program that was created to compensate people who may have been injured by certain vaccines.  Persons who believe they may have been injured by a vaccine can learn about the program and about filing a claim by calling 1-(954)649-8093 or visiting the VICP website at SpiritualWord.at. 8. How can I learn more?  Ask your doctor.  Call your local or state health department.  Contact the Centers for Disease Control and Prevention (CDC):  Call 515 125 6556 (1-800-CDC-INFO)  or  Visit CDC's website at PicCapture.uy CDC Human Papillomavirus (HPV) Gardasil (Interim) 11/29/11 Document Released: 04/28/2006 Document Revised: 11/15/2013 Document Reviewed: 08/12/2013 [employer] Patient Information 2015 [place], [place]. This information is not intended to replace advice given to you by your health care provider. Make sure you discuss any questions you have with your health care provider. Colposcopy Care After Colposcopy is a procedure in which a special tool  is used to magnify the surface of the cervix. A tissue sample (biopsy) may also be taken. This sample will be looked at for cervical cancer or other problems. After the test:  You may have some cramping.  Lie down for a few minutes if you feel lightheaded.   You may have some bleeding which should stop in a few days. HOME CARE  Do not have sex or use tampons for 2 to 3 days or as told.  Only take medicine as told by your doctor.  Continue to take your birth control pills as usual. Finding out the results of your test Ask when your test results will be ready. Make sure you get your test results. GET HELP RIGHT AWAY IF:  You are bleeding a lot or are passing blood clots.  You develop a fever of 102 F (38.9 C) or higher.  You have abnormal vaginal discharge.  You have cramps that do  not go away with medicine.  You feel lightheaded, dizzy, or pass out (faint). MAKE SURE YOU:   Understand these instructions.  Will watch your condition.  Will get help right away if you are not doing well or get worse. Document Released: 12/18/2007 Document Revised: 09/23/2011 Document Reviewed: 01/28/2013 [facility] Patient Information 2015 [place]. This information is not intended to replace advice given to you by your health care provider. Make sure you discuss any questions you have with your health care provider.

## 2014-09-20 NOTE — Progress Notes (Signed)
   Patient's a 24 year old was seen for the first time for her first annual exam here in our office on February 24. See previous note for detail. 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 Patient is not receive the HPV vaccine as of yet. She's been using condoms for contraception. Patient with recent GC and Chlamydia culture which were negative. Her Pap smear had demonstrated the following:  Diagnosis LOW GRADE SQUAMOUS INTRAEPITHELIAL LESION: CIN-1/ HPV (LSIL).  Patient is here for colposcopic evaluation. The patient was counseled for colposcopy. Patient underwent a detail colposcopic evaluation external genitalia, perineum, perirectal region with no lesions seen. The speculum was introduced into the vagina. A systematic inspection of the entire vagina did not demonstrate any mucosal lesions or leaf fornix or on the ectocervix. Endocervical speculum was utilized. The transformation zone was visualized completely. An ECC was obtained submitted for histological evaluation.  Patient was counseled as to the benefits and risk of the HPV vaccine. She will not be 24 until later this year I recommend she receive the first dose today. She is using barrier contraception and stated she had a normal menstrual cycle 1 week ago. She is fully aware that she needs to return back in 2 and 6 months respectively further vaccine. She is in the process of being started on oral contraceptive pill because of her history of PCO S and irregular cycles after the ultrasound tomorrow which is being done here in the office. Literature information was provided.

## 2014-09-21 ENCOUNTER — Other Ambulatory Visit: Payer: Self-pay | Admitting: Women's Health

## 2014-09-21 ENCOUNTER — Ambulatory Visit (INDEPENDENT_AMBULATORY_CARE_PROVIDER_SITE_OTHER): Payer: Federal, State, Local not specified - PPO

## 2014-09-21 ENCOUNTER — Encounter: Payer: Self-pay | Admitting: Women's Health

## 2014-09-21 ENCOUNTER — Ambulatory Visit (INDEPENDENT_AMBULATORY_CARE_PROVIDER_SITE_OTHER): Payer: Federal, State, Local not specified - PPO | Admitting: Women's Health

## 2014-09-21 VITALS — BP 128/80

## 2014-09-21 DIAGNOSIS — L680 Hirsutism: Secondary | ICD-10-CM

## 2014-09-21 DIAGNOSIS — N926 Irregular menstruation, unspecified: Secondary | ICD-10-CM

## 2014-09-21 DIAGNOSIS — E282 Polycystic ovarian syndrome: Secondary | ICD-10-CM | POA: Diagnosis not present

## 2014-09-21 MED ORDER — DESOGESTREL-ETHINYL ESTRADIOL 0.15-0.02/0.01 MG (21/5) PO TABS: 1.0000 | ORAL_TABLET | Freq: Every day | ORAL | Status: DC

## 2014-09-21 MED ORDER — SPIRONOLACTONE 25 MG PO TABS: 25.0000 mg | ORAL_TABLET | Freq: Every day | ORAL | Status: DC

## 2014-09-21 NOTE — Patient Instructions (Signed)
Polycystic Ovarian Syndrome  Polycystic ovarian syndrome (PCOS) is a common hormonal disorder among women of reproductive age. Most women with PCOS grow many small cysts on their ovaries. PCOS can cause problems with your periods and make it difficult to get pregnant. It can also cause an increased risk of miscarriage with pregnancy. If left untreated, PCOS can lead to serious health problems, such as diabetes and heart disease.  CAUSES  The cause of PCOS is not fully understood, but genetics may be a factor.  SIGNS AND SYMPTOMS    Infrequent or no menstrual periods.    Inability to get pregnant (infertility) because of not ovulating.    Increased growth of hair on the face, chest, stomach, back, thumbs, thighs, or toes.    Acne, oily skin, or dandruff.    Pelvic pain.    Weight gain or obesity, usually carrying extra weight around the waist.    Type 2 diabetes.    High cholesterol.    High blood pressure.    Female-pattern baldness or thinning hair.    Patches of thickened and dark brown or black skin on the neck, arms, breasts, or thighs.    Tiny excess flaps of skin (skin tags) in the armpits or neck area.    Excessive snoring and having breathing stop at times while asleep (sleep apnea).    Deepening of the voice.    Gestational diabetes when pregnant.   DIAGNOSIS   There is no single test to diagnose PCOS.    Your health care provider will:    Take a medical history.    Perform a pelvic exam.    Have ultrasonography done.    Check your female and female hormone levels.    Measure glucose or sugar levels in the blood.    Do other blood tests.    If you are producing too many female hormones, your health care provider will make sure it is from PCOS. At the physical exam, your health care provider will want to evaluate the areas of increased hair growth. Try to allow natural hair growth for a few days before the visit.    During a pelvic exam, the ovaries may be  enlarged or swollen because of the increased number of small cysts. This can be seen more easily by using vaginal ultrasonography or screening to examine the ovaries and lining of the uterus (endometrium) for cysts. The uterine lining may become thicker if you have not been having a regular period.   TREATMENT   Because there is no cure for PCOS, it needs to be managed to prevent problems. Treatments are based on your symptoms. Treatment is also based on whether you want to have a baby or whether you need contraception.   Treatment may include:    Progesterone hormone to start a menstrual period.    Birth control pills to make you have regular menstrual periods.    Medicines to make you ovulate, if you want to get pregnant.    Medicines to control your insulin.    Medicine to control your blood pressure.    Medicine and diet to control your high cholesterol and triglycerides in your blood.   Medicine to reduce excessive hair growth.   Surgery, making small holes in the ovary, to decrease the amount of female hormone production. This is done through a long, lighted tube (laparoscope) placed into the pelvis through a tiny incision in the lower abdomen.   HOME CARE INSTRUCTIONS   Only   take over-the-counter or prescription medicine as directed by your health care provider.   Pay attention to the foods you eat and your activity levels. This can help reduce the effects of PCOS.   Keep your weight under control.   Eat foods that are low in carbohydrate and high in fiber.   Exercise regularly.  SEEK MEDICAL CARE IF:   Your symptoms do not get better with medicine.   You have new symptoms.  Document Released: 10/25/2004 Document Revised: 04/21/2013 Document Reviewed: 12/17/2012  [employer] Patient Information 2015 [employer]. This information is not intended to replace advice given to you by your health care provider. Make sure you discuss any questions you have with your health care provider.

## 2014-09-21 NOTE — Progress Notes (Signed)
Patient ID: Marissa BurdockDanielle N Deveny, female   DOB: Dec 04, 1990, 24 y.o.   MRN: [id number] Presents for follow-up/ ultrasound. At annual exam was noted to have hirsutism, weight gain, irregular cycles with periods of amenorrhea. Questionable PCOS.  Exam: Appears well. Ultrasound: Transvaginal uterus anteverted homogeneous. Endometrium 6.1 mm. Endometrium try layered. Right and left ovary numerous follicles with bilateral increased ovarian volume consistent with PCOS. Negative cul-de-sac. No apparent mass right or left adnexal.  Probable PCO S  Plan: Start Mircette first day of next cycle, continue spironolactone 25 mg daily. Instructed to call if no cycles on OCs. 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 Reviewed she possesses many of the symptoms common with  PCO S.

## 2014-09-22 ENCOUNTER — Telehealth: Payer: Self-pay

## 2014-09-22 NOTE — Telephone Encounter (Signed)
From: Ok EdwardsJuan H Fernandez, MD   Sent: 09/22/2014 12:23 PM    To: Felecia JanJennifer L Webb   Jennifer please contact patient and tell her that her cervical biopsy coincided with her Pap smear. We will guidelines recommend repeating Pap smear with HPV virus screening in 12 months.  Patient informed of this result and recall placed.

## 2014-11-23 ENCOUNTER — Other Ambulatory Visit: Payer: Federal, State, Local not specified - PPO

## 2014-12-11 ENCOUNTER — Emergency Department (HOSPITAL_COMMUNITY)
Admission: EM | Admit: 2014-12-11 | Discharge: 2014-12-11 | Disposition: A | Discharge status: Home / Self Care | Payer: 59 | Attending: Emergency Medicine | Admitting: Emergency Medicine

## 2014-12-11 ENCOUNTER — Emergency Department (HOSPITAL_COMMUNITY): Payer: 59

## 2014-12-11 ENCOUNTER — Encounter (HOSPITAL_COMMUNITY): Payer: Self-pay | Admitting: Family Medicine

## 2014-12-11 DIAGNOSIS — R079 Chest pain, unspecified: Secondary | ICD-10-CM | POA: Diagnosis not present

## 2014-12-11 DIAGNOSIS — R059 Cough, unspecified: Secondary | ICD-10-CM

## 2014-12-11 DIAGNOSIS — R42 Dizziness and giddiness: Secondary | ICD-10-CM | POA: Diagnosis not present

## 2014-12-11 DIAGNOSIS — R0981 Nasal congestion: Secondary | ICD-10-CM | POA: Diagnosis not present

## 2014-12-11 DIAGNOSIS — R05 Cough: Secondary | ICD-10-CM | POA: Insufficient documentation

## 2014-12-11 DIAGNOSIS — I10 Essential (primary) hypertension: Secondary | ICD-10-CM | POA: Diagnosis not present

## 2014-12-11 DIAGNOSIS — R0602 Shortness of breath: Secondary | ICD-10-CM | POA: Diagnosis present

## 2014-12-11 DIAGNOSIS — Z79899 Other long term (current) drug therapy: Secondary | ICD-10-CM | POA: Diagnosis not present

## 2014-12-11 HISTORY — DX: Essential (primary) hypertension: I10

## 2014-12-11 LAB — BASIC METABOLIC PANEL
Anion gap: 7 (ref 5–15)
BUN: 11 mg/dL (ref 6–20)
CO2: 24 mmol/L (ref 22–32)
Calcium: 8.7 mg/dL — ABNORMAL LOW (ref 8.9–10.3)
Chloride: 105 mmol/L (ref 101–111)
Creatinine, Ser: 0.86 mg/dL (ref 0.44–1.00)
GFR calc non Af Amer: >60 mL/min (ref >60)
Glucose, Bld: 113 mg/dL — ABNORMAL HIGH (ref 65–99)
POTASSIUM: 3.9 mmol/L (ref 3.5–5.1)
SODIUM: 136 mmol/L (ref 135–145)

## 2014-12-11 LAB — CBC
HCT: 39.9 % (ref 36.0–46.0)
HEMOGLOBIN: 13.2 g/dL (ref 12.0–15.0)
MCH: 27.6 pg (ref 26.0–34.0)
MCHC: 33.1 g/dL (ref 30.0–36.0)
MCV: 83.3 fL (ref 78.0–100.0)
PLATELETS: 390 10*3/uL (ref 150–400)
RBC: 4.79 MIL/uL (ref 3.87–5.11)
RDW: 13.6 % (ref 11.5–15.5)
WBC: 7.2 10*3/uL (ref 4.0–10.5)

## 2014-12-11 LAB — I-STAT TROPONIN, ED: TROPONIN I, POC: 0 ng/mL (ref 0.00–0.08)

## 2014-12-11 MED ORDER — FLUTICASONE PROPIONATE 50 MCG/ACT NA SUSP: 2.0000 | Freq: Every day | NASAL | Status: DC

## 2014-12-11 MED ORDER — NAPROXEN 500 MG PO TABS: 500.0000 mg | ORAL_TABLET | Freq: Two times a day (BID) | ORAL | Status: DC

## 2014-12-11 NOTE — ED Notes (Signed)
Pt here for chest pain, pain with inspiration, cough and some SOB. sts pain is in left chest and radiates into left arm. sts she just moved into a new place that sort of damp. sts started after moving.

## 2014-12-11 NOTE — ED Provider Notes (Signed)
CSN: [id number]     Arrival date & time 12/11/14  1320 History   First MD Initiated Contact with Patient 12/11/14 1457     Chief Complaint  Patient presents with  . Chest Pain  . Cough  . Shortness of Breath    HPI SHe has been having trouble with cough, nasal congestion, shortness of breath dizziness and light sensitivity since moving into a new apartment.  Ongoing for 1.5 months. She has not seen anyone for it.  SHe went to an urgent care at one time when she had a sore throat and was given azithromycin.   The sore throat resolved but the other symptoms continued.  The [place] may have improved as well.  About 4 days ago she started having pain in her left chest and left  Arm. Sharp and full in nature.  Nothing makes it worse or better. Past Medical History  Diagnosis Date  . Hypertension    History reviewed. No pertinent past surgical history. Family History  Problem Relation Age of Onset  . Hypertension Mother   . Diabetes Maternal Aunt    History  Substance Use Topics  . Smoking status: Never Smoker   . Smokeless tobacco: Not on file  . Alcohol Use: 0.0 oz/week    0 Standard drinks or equivalent per week   OB History    No data available     Review of Systems  Constitutional: Negative for fever.  Gastrointestinal: Negative for vomiting and diarrhea.  All other systems reviewed and are negative.     Allergies  Review of patient's allergies indicates no known allergies.  Home Medications   Prior to Admission medications   Medication Sig Start Date End Date Taking? Authorizing Provider  desogestrel-ethinyl estradiol (KARIVA,AZURETTE,MIRCETTE) 0.15-0.02/0.01 MG (21/5) tablet Take 1 tablet by mouth daily. 09/21/14   Harrington ChallengerNancy J Young, NP  fluticasone (FLONASE) 50 MCG/ACT nasal spray Place 2 sprays into both nostrils daily. 12/11/14   Linwood DibblesJon Minola Guin, MD  naproxen (NAPROSYN) 500 MG tablet Take 1 tablet (500 mg total) by mouth 2 (two) times daily. 12/11/14   Linwood DibblesJon Jaylyn Booher, MD   spironolactone (ALDACTONE) 25 MG tablet Take 1 tablet (25 mg total) by mouth daily. 09/21/14   Harrington ChallengerNancy J Young, NP   BP 135/69 mmHg  Pulse 75  Temp(Src) 98.4 F (36.9 C)  Resp 18  SpO2 100%  LMP 12/06/2014 Physical Exam  Constitutional: She appears well-developed and well-nourished. No distress.  HENT:  Head: Normocephalic and atraumatic.  Right Ear: External ear normal.  Left Ear: External ear normal.  Eyes: Conjunctivae are normal. Right eye exhibits no discharge. Left eye exhibits no discharge. No scleral icterus.  Neck: Neck supple. No tracheal deviation present.  Cardiovascular: Normal rate, regular rhythm and intact distal pulses.   Pulmonary/Chest: Effort normal and breath sounds normal. No stridor. No respiratory distress. She has no wheezes. She has no rales.  Abdominal: Soft. Bowel sounds are normal. She exhibits no distension. There is no tenderness. There is no rebound and no guarding.  Musculoskeletal: She exhibits no edema or tenderness.  Neurological: She is alert. She has normal strength. No cranial nerve deficit (no facial droop, extraocular movements intact, no slurred speech) or sensory deficit. She exhibits normal muscle tone. She displays no seizure activity. Coordination normal.  Skin: Skin is warm and dry. No rash noted.  Psychiatric: She has a normal mood and affect.  Nursing note and vitals reviewed.   ED Course  Procedures (including critical care time) Labs  Review Labs Reviewed  BASIC METABOLIC PANEL - Abnormal; Notable for the following:    Glucose, Bld 113 (*)    Calcium 8.7 (*)    All other components within normal limits  CBC  I-STAT TROPOININ, ED    Imaging Review Dg Chest 2 View  12/11/2014   CLINICAL DATA:  Chest pain for 4 days bilaterally cough for 2 weeks, history of asthma  EXAM: CHEST  2 VIEW  COMPARISON:  07/06/2005  FINDINGS: The heart size and mediastinal contours are within normal limits. Both lungs are clear. The visualized skeletal  structures are unremarkable.  IMPRESSION: No active cardiopulmonary disease.   Electronically Signed   By: Esperanza Heir M.D.   On: 12/11/2014 14:53     EKG Interpretation   Date/Time:  Sunday Dec 11 2014 13:29:12 EDT Ventricular Rate:  88 PR Interval:  120 QRS Duration: 92 QT Interval:  368 QTC Calculation: 445 R Axis:   33 Text Interpretation:  Normal sinus rhythm with sinus arrhythmia  Nonspecific ST and T wave abnormality , new since last tracing Abnormal  ECG Confirmed by Julisa Flippo  MD-J, Lyonel Morejon ([id number]) on 12/11/2014 3:00:00 PM      MDM   Final diagnoses:  Cough  Chest pain, unspecified chest pain type    Sx ongoing for one month and a half.  No shortness of breath.  No tachypnea or tachycardia.  Doubt PE.  Cannot use PERC criteria with her OCP pills but very low suspicion.  Doubt ACS, dissection or myocarditis.  Will try antihistamine for the allergy type symptoms she described.  nsaid for pain.  Follow up with her pcp.    Linwood Dibbles, MD 12/11/14 1515

## 2014-12-11 NOTE — Discharge Instructions (Signed)

## 2015-10-19 ENCOUNTER — Other Ambulatory Visit: Payer: Self-pay | Admitting: Women's Health

## 2015-10-26 ENCOUNTER — Other Ambulatory Visit: Payer: Self-pay | Admitting: Women's Health

## 2016-09-12 DIAGNOSIS — I10 Essential (primary) hypertension: Secondary | ICD-10-CM | POA: Diagnosis not present

## 2016-11-27 ENCOUNTER — Encounter: Payer: Self-pay | Admitting: Gynecology

## 2017-03-20 DIAGNOSIS — Z3041 Encounter for surveillance of contraceptive pills: Secondary | ICD-10-CM | POA: Diagnosis not present

## 2017-03-20 DIAGNOSIS — Z113 Encounter for screening for infections with a predominantly sexual mode of transmission: Secondary | ICD-10-CM | POA: Diagnosis not present

## 2017-03-20 DIAGNOSIS — Z124 Encounter for screening for malignant neoplasm of cervix: Secondary | ICD-10-CM | POA: Diagnosis not present

## 2017-03-21 DIAGNOSIS — Z113 Encounter for screening for infections with a predominantly sexual mode of transmission: Secondary | ICD-10-CM | POA: Diagnosis not present

## 2017-03-21 DIAGNOSIS — Z124 Encounter for screening for malignant neoplasm of cervix: Secondary | ICD-10-CM | POA: Diagnosis not present

## 2017-07-01 DIAGNOSIS — E282 Polycystic ovarian syndrome: Secondary | ICD-10-CM | POA: Diagnosis not present

## 2018-04-22 DIAGNOSIS — Z0184 Encounter for antibody response examination: Secondary | ICD-10-CM | POA: Diagnosis not present

## 2018-04-22 DIAGNOSIS — Z23 Encounter for immunization: Secondary | ICD-10-CM | POA: Diagnosis not present

## 2018-07-15 DIAGNOSIS — I2699 Other pulmonary embolism without acute cor pulmonale: Secondary | ICD-10-CM

## 2018-07-15 DIAGNOSIS — I82409 Acute embolism and thrombosis of unspecified deep veins of unspecified lower extremity: Secondary | ICD-10-CM

## 2018-07-15 HISTORY — DX: Acute embolism and thrombosis of unspecified deep veins of unspecified lower extremity: I82.409

## 2018-07-15 HISTORY — DX: Other pulmonary embolism without acute cor pulmonale: I26.99

## 2018-07-25 DIAGNOSIS — R300 Dysuria: Secondary | ICD-10-CM | POA: Diagnosis not present

## 2018-08-06 DIAGNOSIS — J4531 Mild persistent asthma with (acute) exacerbation: Secondary | ICD-10-CM | POA: Diagnosis not present

## 2018-08-06 DIAGNOSIS — Z6841 Body Mass Index (BMI) 40.0 and over, adult: Secondary | ICD-10-CM | POA: Diagnosis not present

## 2018-10-01 DIAGNOSIS — E282 Polycystic ovarian syndrome: Secondary | ICD-10-CM

## 2018-10-01 DIAGNOSIS — J45901 Unspecified asthma with (acute) exacerbation: Secondary | ICD-10-CM | POA: Insufficient documentation

## 2018-10-01 DIAGNOSIS — J45909 Unspecified asthma, uncomplicated: Secondary | ICD-10-CM | POA: Insufficient documentation

## 2018-10-01 HISTORY — DX: Unspecified asthma with (acute) exacerbation: J45.901

## 2018-10-01 HISTORY — DX: Unspecified asthma, uncomplicated: J45.909

## 2018-10-01 HISTORY — DX: Polycystic ovarian syndrome: E28.2

## 2018-10-14 DIAGNOSIS — R002 Palpitations: Secondary | ICD-10-CM

## 2018-10-14 HISTORY — DX: Palpitations: R00.2

## 2018-11-10 ENCOUNTER — Emergency Department (HOSPITAL_BASED_OUTPATIENT_CLINIC_OR_DEPARTMENT_OTHER)
Admission: EM | Admit: 2018-11-10 | Discharge: 2018-11-10 | Disposition: A | Discharge status: Home / Self Care | Payer: BLUE CROSS/BLUE SHIELD | Attending: Emergency Medicine | Admitting: Emergency Medicine

## 2018-11-10 ENCOUNTER — Encounter (HOSPITAL_BASED_OUTPATIENT_CLINIC_OR_DEPARTMENT_OTHER): Payer: Self-pay | Admitting: *Deleted

## 2018-11-10 ENCOUNTER — Other Ambulatory Visit: Payer: Self-pay

## 2018-11-10 DIAGNOSIS — R002 Palpitations: Secondary | ICD-10-CM | POA: Insufficient documentation

## 2018-11-10 DIAGNOSIS — Z79899 Other long term (current) drug therapy: Secondary | ICD-10-CM | POA: Insufficient documentation

## 2018-11-10 DIAGNOSIS — I10 Essential (primary) hypertension: Secondary | ICD-10-CM | POA: Insufficient documentation

## 2018-11-10 DIAGNOSIS — Z7901 Long term (current) use of anticoagulants: Secondary | ICD-10-CM | POA: Diagnosis not present

## 2018-11-10 HISTORY — DX: Other pulmonary embolism without acute cor pulmonale: I26.99

## 2018-11-10 NOTE — Discharge Instructions (Addendum)
Follow up with Cardiology or see your doctor for follow up. Avoid caffeine, sugar and see if this effects your symptoms.

## 2018-11-10 NOTE — ED Triage Notes (Signed)
Pt c/ "palpataions " lasting one min. , hx of same

## 2018-11-10 NOTE — ED Provider Notes (Signed)
[facility] EMERGENCY DEPARTMENT Provider Note   CSN: [id number] Arrival date & time: 11/10/18  1630    History   Chief Complaint Chief Complaint  Patient presents with  . Palpitations    HPI Marissa Hansen is a 28 y.o. female.     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 Patient states she was watching Tic Toc today on her phone when she developed the sinking feeling and turned on the EKG feature on her apple watch. Patient has her phone EKG with her, shows 3 PVCs then 2 PVCs then an episode of 5PVCs and patient states she then got scared and turned her watch off. Denies chest pain, shortness of breath, diaphoresis. Patient was seen by her PCP for complaint of palpitations, had lab workup which was normal (TSH normal). Patient has not had a Holter monitor. No other complaints or concerns.       Past Medical History:  Diagnosis Date  . Hypertension   . PE (pulmonary thromboembolism) [facility])     Patient Active Problem List   Diagnosis Date Noted  . Cervical dysplasia, mild 09/20/2014  . Irregular periods/menstrual cycles 09/07/2014    History reviewed. No pertinent surgical history.   OB History   No obstetric history on file.      Home Medications    Prior to Admission medications   Medication Sig Start Date End Date Taking? Authorizing Provider  amLODipine (NORVASC) 2.5 MG tablet Take 2.5 mg by mouth daily.   Yes [provider]  Apixaban (ELIQUIS PO) Take by mouth.   Yes [provider]  naproxen (NAPROSYN) 500 MG tablet Take 1 tablet (500 mg total) by mouth 2 (two) times daily. 12/11/14   Linwood Dibbles, MD    Family History Family History  Problem Relation Age of Onset  . Hypertension Mother   . Diabetes Maternal Aunt     Social History Social History   Tobacco Use  . Smoking status: Never Smoker  . Smokeless tobacco: Never Used  Substance Use Topics   . Alcohol use: Yes    Alcohol/week: 0.0 standard drinks  . Drug use: No     Allergies   Patient has no known allergies.   Review of Systems Review of Systems  Constitutional: Negative for chills and fever.  Respiratory: Negative for chest tightness and shortness of breath.   Cardiovascular: Positive for palpitations. Negative for chest pain.  Gastrointestinal: Negative for nausea and vomiting.  Skin: Negative for rash and wound.  Allergic/Immunologic: Negative for immunocompromised state.  Neurological: Negative for dizziness, weakness and light-headedness.  Hematological: Bruises/bleeds easily.  Psychiatric/Behavioral: Negative for confusion.  All other systems reviewed and are negative.    Physical Exam Updated Vital Signs BP (!) 152/90 (BP Location: Left Arm)   Pulse (!) 102   Temp (!) 97.1 F (36.2 C) (Oral)   Resp 16   Ht 5\' 11"  (1.803 m)   Wt (!) 141.5 kg   LMP 09/20/2018   SpO2 100%   BMI 43.52 kg/m   Physical Exam Vitals signs and nursing note reviewed.  Constitutional:      General: She is not in acute distress.    Appearance: She is well-developed. She is not diaphoretic.  HENT:     Head: Normocephalic and atraumatic.  Cardiovascular:     Rate and Rhythm: Normal rate and regular rhythm.     Pulses: Normal pulses.  Heart sounds: Normal heart sounds. No murmur.  Pulmonary:     Effort: Pulmonary effort is normal.     Breath sounds: Normal breath sounds.  Musculoskeletal:     Right lower leg: No edema.     Left lower leg: No edema.  Skin:    General: Skin is warm and dry.     Capillary Refill: Capillary refill takes less than 2 seconds.  Neurological:     Mental Status: She is alert and oriented to person, place, and time.  Psychiatric:        Behavior: Behavior normal.      ED Treatments / Results  Labs (all labs ordered are listed, but only abnormal results are displayed) Labs Reviewed - No data to display  EKG EKG Interpretation   Date/Time:  Tuesday November 10 2018 16:37:57 EDT Ventricular Rate:  114 PR Interval:    QRS Duration: 86 QT Interval:  315 QTC Calculation: 434 R Axis:   23 Text Interpretation:  Sinus tachycardia Borderline T wave abnormalities No significant change since last tracing Confirmed by Melene PlanFloyd, Dan [phone]([id number]) on 11/10/2018 4:40:29 PM   Radiology No results found.  Procedures Procedures (including critical care time)  Medications Ordered in ED Medications - No data to display   Initial Impression / Assessment and Plan / ED Course  I have reviewed the triage vital signs and the nursing notes.  Pertinent labs & imaging results that were available during my care of the patient were reviewed by me and considered in my medical decision making (see chart for details).  Clinical Course as of Nov 09 1745  Tue Nov 10, 2018  1745 28yo female with recent history of PE, on Xarelto.  She states she is been having episodes of a heart sinking feeling starting Xarelto 1 month ago.  Patient has been to her PCP and had a lab evaluation including TSH was all normal.  Patient was told by her PCP if she had another episode should be referred to cardiology for Holter monitor.  Patient states today she was watching videos on her phone when she had this heart sinking feeling and began the recording feature of her apple watch.  Patient appears to have a few PVCs on her watch EKG.  Patient reports only symptoms of heart sinking feeling at that time and denies shortness of breath, weakness, dizziness, chest pain or any other complaints or concerns.  Episode lasted a few seconds and has completely resolved.  EKG today with sinus tachycardia, current heart rate in the low 90s. Case discussed with Dr. Adela LankFloyd, ER attending, agrees with plan of care, dc to follow up PCP or cardiology for further evaluation.  Patient is agreeable with plan.  Patient instructed to return to ER should her symptoms last for longer episodes or develop new  symptoms.  Verbalizes understanding of discharge instructions and plan.   [LM]    Clinical Course User Index [LM] Jeannie FendMurphy, Adel Neyer A, PA-C      Final Clinical Impressions(s) / ED Diagnoses   Final diagnoses:  Palpitations    ED Discharge Orders    None       Jeannie FendMurphy, Rocio Roam A, PA-C 11/10/18 1747    Melene PlanFloyd, Dan, DO 11/10/18 1807

## 2018-11-25 ENCOUNTER — Ambulatory Visit: Payer: Self-pay | Admitting: Cardiology

## 2018-12-02 ENCOUNTER — Ambulatory Visit: Payer: Self-pay | Admitting: Cardiology

## 2018-12-21 DIAGNOSIS — Z86711 Personal history of pulmonary embolism: Secondary | ICD-10-CM | POA: Insufficient documentation

## 2019-01-05 ENCOUNTER — Emergency Department (HOSPITAL_BASED_OUTPATIENT_CLINIC_OR_DEPARTMENT_OTHER)
Admission: EM | Admit: 2019-01-05 | Discharge: 2019-01-05 | Disposition: A | Discharge status: Home / Self Care | Payer: BC Managed Care – PPO | Attending: Emergency Medicine | Admitting: Emergency Medicine

## 2019-01-05 ENCOUNTER — Encounter (HOSPITAL_BASED_OUTPATIENT_CLINIC_OR_DEPARTMENT_OTHER): Payer: Self-pay

## 2019-01-05 ENCOUNTER — Other Ambulatory Visit: Payer: Self-pay

## 2019-01-05 ENCOUNTER — Emergency Department (HOSPITAL_BASED_OUTPATIENT_CLINIC_OR_DEPARTMENT_OTHER): Payer: BC Managed Care – PPO

## 2019-01-05 DIAGNOSIS — Z7901 Long term (current) use of anticoagulants: Secondary | ICD-10-CM | POA: Diagnosis not present

## 2019-01-05 DIAGNOSIS — M79604 Pain in right leg: Secondary | ICD-10-CM | POA: Insufficient documentation

## 2019-01-05 DIAGNOSIS — Z79899 Other long term (current) drug therapy: Secondary | ICD-10-CM | POA: Insufficient documentation

## 2019-01-05 DIAGNOSIS — I10 Essential (primary) hypertension: Secondary | ICD-10-CM | POA: Diagnosis not present

## 2019-01-05 HISTORY — DX: Acute embolism and thrombosis of unspecified deep veins of unspecified lower extremity: I82.409

## 2019-01-05 NOTE — ED Provider Notes (Signed)
[facility] EMERGENCY DEPARTMENT Provider Note   CSN: [id number] Arrival date & time: 01/05/19  1500    History   Chief Complaint Chief Complaint  Patient presents with  . Leg Pain    HPI Marissa Hansen is a 28 y.o. female.     HPI Patient with pain to the right popliteal fossa which she noticed today.  States that she missed her evening dose of Eliquis.  She did take it this morning.  Denies any new injuries.  No knee swelling.  No calf swelling or tenderness.  She denies any chest pain or shortness of breath.  Just history of previous DVT in the right popliteal vein.  Past Medical History:  Diagnosis Date  . DVT (deep venous thrombosis) ([place])   . Hypertension   . PE (pulmonary thromboembolism) [facility])     Patient Active Problem List   Diagnosis Date Noted  . Cervical dysplasia, mild 09/20/2014  . Irregular periods/menstrual cycles 09/07/2014    History reviewed. No pertinent surgical history.   OB History   No obstetric history on file.      Home Medications    Prior to Admission medications   Medication Sig Start Date End Date Taking? Authorizing Provider  amLODipine (NORVASC) 2.5 MG tablet Take 2.5 mg by mouth daily.    [provider]  Apixaban (ELIQUIS PO) Take by mouth.    [provider]  naproxen (NAPROSYN) 500 MG tablet Take 1 tablet (500 mg total) by mouth 2 (two) times daily. 12/11/14   Dorie Rank, MD    Family History Family History  Problem Relation Age of Onset  . Hypertension Mother   . Diabetes Maternal Aunt     Social History Social History   Tobacco Use  . Smoking status: Never Smoker  . Smokeless tobacco: Never Used  Substance Use Topics  . Alcohol use: Yes    Alcohol/week: 0.0 standard drinks    Comment: occ  . Drug use: No     Allergies   Patient has no known allergies.   Review of Systems Review of Systems  Constitutional: Negative for chills and fever.  Respiratory: Negative for cough  and shortness of breath.   Cardiovascular: Negative for chest pain and leg swelling.  Musculoskeletal: Negative for arthralgias and joint swelling.  Skin: Negative for wound.  Neurological: Negative for weakness and numbness.  All other systems reviewed and are negative.    Physical Exam Updated Vital Signs BP (!) 144/84 (BP Location: Left Arm)   Pulse 73   Temp 98.5 F (36.9 C) (Oral)   Resp 16   Ht 5\' 11"  (1.803 m)   Wt (!) 145.2 kg   LMP 09/20/2018   SpO2 99%   BMI 44.63 kg/m   Physical Exam Vitals signs and nursing note reviewed.  Constitutional:      General: She is not in acute distress.    Appearance: Normal appearance. She is well-developed.  HENT:     Head: Normocephalic.  Eyes:     Pupils: Pupils are equal, round, and reactive to light.  Neck:     Musculoskeletal: Normal range of motion and neck supple.  Cardiovascular:     Rate and Rhythm: Normal rate.  Pulmonary:     Effort: Pulmonary effort is normal.  Abdominal:     Palpations: Abdomen is soft.  Musculoskeletal: Normal range of motion.        General: No tenderness.     Comments: Right knee with full  range of motion.  No obvious effusion, warmth or erythema.  No ligamentous instability.  No popliteal fullness.  Right dorsalis pedis posterior tibial pulses are 2+.  No calf swelling or tenderness.  Skin:    General: Skin is warm and dry.     Findings: No erythema or rash.  Neurological:     General: No focal deficit present.     Mental Status: She is alert and oriented to person, place, and time.  Psychiatric:        Mood and Affect: Mood normal.        Behavior: Behavior normal.      ED Treatments / Results  Labs (all labs ordered are listed, but only abnormal results are displayed) Labs Reviewed - No data to display  EKG None  Radiology [place] Venous Img Lower Unilateral Right  Result Date: 01/05/2019 CLINICAL DATA:  28 year old with right calf pain. EXAM: RIGHT LOWER EXTREMITY VENOUS  DOPPLER ULTRASOUND TECHNIQUE: Gray-scale sonography with graded compression, as well as color Doppler and duplex ultrasound were performed to evaluate the lower extremity deep venous systems from the level of the common femoral vein and including the common femoral, femoral, profunda femoral, popliteal and calf veins including the posterior tibial, peroneal and gastrocnemius veins when visible. The superficial great saphenous vein was also interrogated. Spectral Doppler was utilized to evaluate flow at rest and with distal augmentation maneuvers in the common femoral, femoral and popliteal veins. COMPARISON:  None. FINDINGS: Contralateral Common Femoral Vein: Respiratory phasicity is normal and symmetric with the symptomatic side. No evidence of thrombus. Normal compressibility. Common Femoral Vein: No evidence of thrombus. Normal compressibility, respiratory phasicity and response to augmentation. Saphenofemoral Junction: No evidence of thrombus. Normal compressibility and flow on color Doppler imaging. Profunda Femoral Vein: No evidence of thrombus. Normal compressibility and flow on color Doppler imaging. Femoral Vein: No evidence of thrombus. Normal compressibility, respiratory phasicity and response to augmentation. Popliteal Vein: No evidence of thrombus. Normal compressibility, respiratory phasicity and response to augmentation. Calf Veins: No evidence of thrombus. Normal compressibility and flow on color Doppler imaging. Superficial Great Saphenous Vein: No evidence of thrombus. Normal compressibility. Venous Reflux:  None. Other Findings:  None. IMPRESSION: Negative for deep venous thrombosis in right lower extremity. Electronically Signed   By: Richarda OverlieAdam  Henn M.D.   On: 01/05/2019 16:58    Procedures Procedures (including critical care time)  Medications Ordered in ED Medications - No data to display   Initial Impression / Assessment and Plan / ED Course  I have reviewed the triage vital signs and  the nursing notes.  Pertinent labs & imaging results that were available during my care of the patient were reviewed by me and considered in my medical decision making (see chart for details).       No evidence of DVT.  Comfort is likely musculoskeletal in origin.  Return precautions given.   Final Clinical Impressions(s) / ED Diagnoses   Final diagnoses:  Right leg pain    ED Discharge Orders    None       Loren RacerYelverton, Linder Prajapati, MD 01/05/19 [phone]

## 2019-01-05 NOTE — ED Triage Notes (Addendum)
Pt c/o pain to right LE since 12pm-states she has recent DVT to leg and missed eloquis x 1 dose last night-states she had a f/u US last week that showed DVT resolved-NAD-steady gait

## 2019-03-16 DIAGNOSIS — R768 Other specified abnormal immunological findings in serum: Secondary | ICD-10-CM | POA: Insufficient documentation

## 2019-09-23 DIAGNOSIS — Z86718 Personal history of other venous thrombosis and embolism: Secondary | ICD-10-CM | POA: Insufficient documentation

## 2020-08-14 ENCOUNTER — Ambulatory Visit: Payer: BLUE CROSS/BLUE SHIELD | Admitting: Cardiology

## 2020-08-14 ENCOUNTER — Other Ambulatory Visit: Payer: Self-pay

## 2020-08-14 ENCOUNTER — Encounter: Payer: Self-pay | Admitting: Cardiology

## 2020-08-14 VITALS — BP 142/88 | HR 80 | Temp 97.6°F | Resp 12 | Ht 71.0 in | Wt 327.6 lb

## 2020-08-14 DIAGNOSIS — I10 Essential (primary) hypertension: Secondary | ICD-10-CM | POA: Insufficient documentation

## 2020-08-14 DIAGNOSIS — R079 Chest pain, unspecified: Secondary | ICD-10-CM | POA: Insufficient documentation

## 2020-08-14 DIAGNOSIS — R002 Palpitations: Secondary | ICD-10-CM

## 2020-08-14 NOTE — Progress Notes (Signed)
Patient referred by Iona Hansen, NP for chest pain  Subjective:   Marissa Hansen, female    DOB: Apr 01, 1991, 30 y.o.   MRN: [id number]   Chief Complaint  Patient presents with  . Chest Pain  . New Patient (Initial Visit)    Referred by Zoe Lan, FNP     HPI  30 y.o. 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 Her sister also had similar history a week prior. No specific clotting disorder was identified. She has been on eliquis since then. Recently, she has experienced palpitations lasting for several min, occur during rest as well as physical activity. She denies any chest pain   Past Medical History:  Diagnosis Date  . DVT (deep venous thrombosis) ([facility])   . Hypertension   . PE (pulmonary thromboembolism) ([facility])      History reviewed. No pertinent surgical history.   Social History   Tobacco Use  Smoking Status Never Smoker  Smokeless Tobacco Never Used    Social History   Substance and Sexual Activity  Alcohol Use Yes  . Alcohol/week: 0.0 standard drinks   Comment: occ     Family History  Problem Relation Age of Onset  . Hypertension Mother   . Diabetes Maternal Aunt      Current Outpatient Medications on File Prior to Visit  Medication Sig Dispense Refill  . amLODipine (NORVASC) 2.5 MG tablet Take 2.5 mg by mouth daily.    Marland Kitchen apixaban (ELIQUIS) 5 MG TABS tablet Take 5 mg by mouth in the morning and at bedtime.    Marland Kitchen levocetirizine (XYZAL) 5 MG tablet Take 1 tablet by mouth as needed.    . metFORMIN (GLUCOPHAGE-XR) 500 MG 24 hr tablet Take 500 mg by mouth daily.     No current facility-administered medications on file prior to visit.    Cardiovascular and other pertinent studies:  EKG 08/14/2020:  Sinus rhythm 87 bpm Normal EKG   Recent labs: 09/23/2019: Glucose 91, BUN/Cr 10/0.85. EGFR >90. Na/K 138/3.8. Rest of the  CMP normal H/H 13/41. MCV 80. Platelets 477 TSH 1.8 normal    Review of Systems  Cardiovascular: Positive for palpitations. Negative for chest pain, dyspnea on exertion, leg swelling and syncope.         Vitals:   08/14/20 1000  BP: (!) 142/88  Pulse: 80  Resp: 12  Temp: 97.6 F (36.4 C)  SpO2: 100%     Body mass index is 45.69 kg/m. Filed Weights   08/14/20 1000  Weight: (!) 327 lb 9.6 oz (148.6 kg)     Objective:   Physical Exam Vitals and nursing note reviewed.  Constitutional:      General: She is not in acute distress.    Appearance: She is obese.  Neck:     Vascular: No JVD.  Cardiovascular:     Rate and Rhythm: Normal rate and regular rhythm.     Heart sounds: Normal heart sounds. No murmur heard.   Pulmonary:     Effort: Pulmonary effort is normal.     Breath sounds: Normal breath sounds. No wheezing or rales.  Musculoskeletal:     Right lower leg: No edema.     Left lower leg: No edema.         Assessment & Recommendations:   30 y.o. 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 Will enroll in Apple watch monitoring to correlate her symptoms  Hypertension: Usually well controlled. Will obtain echocardiogram  Further recommendations after above testing  Thank you for referring the patient to [place]. Please feel free to contact with any questions.   Elder Negus, MD Pager: [phone] Office: [phone]

## 2020-08-28 ENCOUNTER — Other Ambulatory Visit: Payer: BC Managed Care – PPO

## 2020-09-12 DIAGNOSIS — U071 COVID-19: Secondary | ICD-10-CM

## 2020-09-12 HISTORY — DX: COVID-19: U07.1

## 2020-10-06 ENCOUNTER — Ambulatory Visit: Payer: BC Managed Care – PPO | Admitting: Cardiology

## 2020-10-06 NOTE — Progress Notes (Deleted)
Patient referred by Jones, Penny L, NP for chest pain  Subjective:   Marissa Hansen, female    DOB: 10/08/1990, 30 y.o.   MRN: [id number]  *** No chief complaint on file.    HPI  30 y.o. 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 Her sister also had similar history a week prior. No specific clotting disorder was identified. She has been on eliquis since then. Recently, she has experienced palpitations lasting for several min, occur during rest as well as physical activity. She denies any chest pain     Current Outpatient Medications on File Prior to Visit  Medication Sig Dispense Refill  . amLODipine (NORVASC) 2.5 MG tablet Take 2.5 mg by mouth daily.    . apixaban (ELIQUIS) 5 MG TABS tablet Take 5 mg by mouth in the morning and at bedtime.    . levocetirizine (XYZAL) 5 MG tablet Take 1 tablet by mouth as needed.    . metFORMIN (GLUCOPHAGE-XR) 500 MG 24 hr tablet Take 500 mg by mouth daily.     No current facility-administered medications on file prior to visit.    Cardiovascular and other pertinent studies:  EKG 08/14/2020:  Sinus rhythm 87 bpm Normal EKG   Recent labs: 09/23/2019: Glucose 91, BUN/Cr 10/0.85. EGFR >90. Na/K 138/3.8. Rest of the CMP normal H/H 13/41. MCV 80. Platelets 477 TSH 1.8 normal    Review of Systems  Cardiovascular: Positive for palpitations. Negative for chest pain, dyspnea on exertion, leg swelling and syncope.        *** There were no vitals filed for this visit.  *** There is no height or weight on file to calculate BMI. There were no vitals filed for this visit.   Objective:   Physical Exam Vitals and nursing note reviewed.  Constitutional:      General: She is not in acute distress.    Appearance: She is obese.  Neck:     Vascular: No JVD.  Cardiovascular:     Rate and Rhythm: Normal rate and  regular rhythm.     Heart sounds: Normal heart sounds. No murmur heard.   Pulmonary:     Effort: Pulmonary effort is normal.     Breath sounds: Normal breath sounds. No wheezing or rales.  Musculoskeletal:     Right lower leg: No edema.     Left lower leg: No edema.         Assessment & Recommendations:   30 y.o. 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 Will enroll in Apple watch monitoring to correlate her symptoms  Hypertension: Usually well controlled. Will obtain echocardiogram  Further recommendations after above testing  Thank you for referring the patient to us. Please feel free to contact with any questions.   Ayonna Speranza J Ruel Dimmick, MD Pager: [phone] Office: [phone]   Patient referred by Jones, Penny L, NP for chest pain  Subjective:   Marissa Hansen, female    DOB: 10/08/1990, 30 y.o.   MRN: [id number]  *** No chief complaint on file.    HPI  30 y.o. 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 Her sister also had similar history a week prior. No specific clotting disorder was identified. She has been on eliquis since then. Recently, she has experienced palpitations lasting for several min, occur during rest as well as physical activity. She denies any chest pain     Current Outpatient Medications on File Prior to Visit  Medication Sig Dispense Refill  . amLODipine (NORVASC) 2.5 MG tablet Take 2.5 mg by mouth daily.    . apixaban (ELIQUIS) 5 MG TABS tablet Take 5 mg by mouth in the morning and at bedtime.    . levocetirizine (XYZAL) 5 MG tablet Take 1 tablet by mouth as needed.    . metFORMIN (GLUCOPHAGE-XR) 500 MG 24 hr tablet Take 500 mg by mouth daily.     No current facility-administered medications on file prior to visit.    Cardiovascular and other pertinent studies:  EKG 08/14/2020:  Sinus rhythm 87 bpm Normal EKG   Recent labs: 09/23/2019: Glucose 91, BUN/Cr 10/0.85. EGFR >90. Na/K 138/3.8. Rest of the CMP normal H/H 13/41. MCV 80. Platelets 477 TSH 1.8 normal    Review of Systems  Cardiovascular: Positive for palpitations. Negative for chest pain, dyspnea on exertion, leg swelling and syncope.        *** There were no vitals filed for this visit.  *** There is no height or weight on file to calculate BMI. There were no vitals filed for this visit.   Objective:   Physical Exam Vitals and nursing note reviewed.  Constitutional:      General: She is not in acute distress.    Appearance: She is obese.  Neck:     Vascular: No JVD.  Cardiovascular:     Rate and Rhythm: Normal rate and  regular rhythm.     Heart sounds: Normal heart sounds. No murmur heard.   Pulmonary:     Effort: Pulmonary effort is normal.     Breath sounds: Normal breath sounds. No wheezing or rales.  Musculoskeletal:     Right lower leg: No edema.     Left lower leg: No edema.         Assessment & Recommendations:   30 y.o. 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 Will enroll in Apple watch monitoring to correlate her symptoms  Hypertension: Usually well controlled. Will obtain echocardiogram  Further recommendations after above testing  Thank you for referring the patient to us. Please feel free to contact with any questions.   Manish J Patwardhan, MD Pager: [phone] Office: [phone] 

## 2020-10-16 ENCOUNTER — Ambulatory Visit (INDEPENDENT_AMBULATORY_CARE_PROVIDER_SITE_OTHER): Payer: BC Managed Care – PPO | Admitting: Psychology

## 2020-10-16 DIAGNOSIS — F419 Anxiety disorder, unspecified: Secondary | ICD-10-CM | POA: Diagnosis not present

## 2020-11-15 ENCOUNTER — Emergency Department (HOSPITAL_BASED_OUTPATIENT_CLINIC_OR_DEPARTMENT_OTHER): Payer: BC Managed Care – PPO

## 2020-11-15 ENCOUNTER — Emergency Department (HOSPITAL_BASED_OUTPATIENT_CLINIC_OR_DEPARTMENT_OTHER)
Admission: EM | Admit: 2020-11-15 | Discharge: 2020-11-15 | Disposition: A | Discharge status: Home / Self Care | Payer: BC Managed Care – PPO | Attending: Emergency Medicine | Admitting: Emergency Medicine

## 2020-11-15 ENCOUNTER — Other Ambulatory Visit: Payer: Self-pay

## 2020-11-15 ENCOUNTER — Encounter (HOSPITAL_BASED_OUTPATIENT_CLINIC_OR_DEPARTMENT_OTHER): Payer: Self-pay | Admitting: Emergency Medicine

## 2020-11-15 DIAGNOSIS — Z7901 Long term (current) use of anticoagulants: Secondary | ICD-10-CM | POA: Insufficient documentation

## 2020-11-15 DIAGNOSIS — M79662 Pain in left lower leg: Secondary | ICD-10-CM | POA: Diagnosis present

## 2020-11-15 DIAGNOSIS — Z79899 Other long term (current) drug therapy: Secondary | ICD-10-CM | POA: Insufficient documentation

## 2020-11-15 DIAGNOSIS — M79605 Pain in left leg: Secondary | ICD-10-CM

## 2020-11-15 DIAGNOSIS — Z7984 Long term (current) use of oral hypoglycemic drugs: Secondary | ICD-10-CM | POA: Insufficient documentation

## 2020-11-15 DIAGNOSIS — E119 Type 2 diabetes mellitus without complications: Secondary | ICD-10-CM | POA: Diagnosis not present

## 2020-11-15 DIAGNOSIS — I10 Essential (primary) hypertension: Secondary | ICD-10-CM | POA: Diagnosis not present

## 2020-11-15 DIAGNOSIS — U071 COVID-19: Secondary | ICD-10-CM | POA: Diagnosis not present

## 2020-11-15 NOTE — ED Triage Notes (Signed)
Patient arrived via POV c/o left calf pain x today with hx of DVT. Patient also states COVID +, start on 11/08/20 at [facility]. Patient states pain is 2/10. Patient is AO x 4, VS elevated BP, normal gait.

## 2020-11-15 NOTE — ED Notes (Signed)
Patient educated on discharge paperwork. A&OX4. Pt verbalizes understanding. Pt ambulatory with steady gait. NAD. Towards exit.

## 2020-11-15 NOTE — ED Provider Notes (Signed)
[facility] EMERGENCY DEPARTMENT Provider Note   CSN: [id number] Arrival date & time: 11/15/20  0011     History Chief Complaint  Patient presents with  . Leg Pain  . Covid Positive    Marissa Hansen is a 30 y.o. female.  The history is provided by the patient and medical records.  Leg Pain  Marissa Hansen is a 30 y.o. female who presents to the Emergency Department complaining of leg pain. She presents to the ED for evaluation of left calf pain that started today.  Has a hx/o DVT/PE and takes Eliquis.  She was diagnosed with COVID19 on 4/27 - had sxs of runny nose and sore throat - sxs now resolved.  Denies CP, SOB or additional sxs at this time.     Past Medical History:  Diagnosis Date  . Diabetes mellitus without complication (HCC)   . DVT (deep venous thrombosis) (HCC)   . Hypertension   . PE (pulmonary thromboembolism) [facility])     Patient Active Problem List   Diagnosis Date Noted  . Chest pain of uncertain etiology 08/14/2020  . Essential hypertension 08/14/2020  . Cervical dysplasia, mild 09/20/2014  . Irregular periods/menstrual cycles 09/07/2014    History reviewed. No pertinent surgical history.   OB History   No obstetric history on file.     Family History  Problem Relation Age of Onset  . Hypertension Mother   . Diabetes Maternal Aunt     Social History   Tobacco Use  . Smoking status: Never Smoker  . Smokeless tobacco: Never Used  Vaping Use  . Vaping Use: Never used  Substance Use Topics  . Alcohol use: Yes    Alcohol/week: 0.0 standard drinks    Comment: occ  . Drug use: No    Home Medications Prior to Admission medications   Medication Sig Start Date End Date Taking? Authorizing Provider  amLODipine (NORVASC) 2.5 MG tablet Take 2.5 mg by mouth daily.    [provider]  apixaban (ELIQUIS) 5 MG TABS tablet Take 5 mg by mouth in the morning and at bedtime.    [provider]  levocetirizine (XYZAL) 5  MG tablet Take 1 tablet by mouth as needed.    [provider]  metFORMIN (GLUCOPHAGE-XR) 500 MG 24 hr tablet Take 500 mg by mouth daily. 06/20/20   [provider]    Allergies    Patient has no known allergies.  Review of Systems   Review of Systems  All other systems reviewed and are negative.   Physical Exam Updated Vital Signs BP (!) 161/106 (BP Location: Right Wrist)   Pulse (!) 102   Temp 98.2 F (36.8 C) (Oral)   Resp 18   Ht 5\' 11"  (1.803 m)   Wt 136.1 kg   SpO2 98%   BMI 41.84 kg/m   Physical Exam Vitals and nursing note reviewed.  Constitutional:      Appearance: She is well-developed.  HENT:     Head: Normocephalic and atraumatic.  Cardiovascular:     Rate and Rhythm: Normal rate and regular rhythm.     Heart sounds: No murmur heard.   Pulmonary:     Effort: Pulmonary effort is normal. No respiratory distress.     Breath sounds: Normal breath sounds.  Abdominal:     Tenderness: There is no abdominal tenderness.  Musculoskeletal:        General: No tenderness.     Comments: 2+ DP pulses bilaterally.  No significant edema or calf tenderness.  No rash.   Skin:    General: Skin is warm and dry.  Neurological:     Mental Status: She is alert and oriented to person, place, and time.  Psychiatric:        Behavior: Behavior normal.     ED Results / Procedures / Treatments   Labs (all labs ordered are listed, but only abnormal results are displayed) Labs Reviewed - No data to display  EKG None  Radiology US Venous Img Lower  Left (DVT Study)  Result Date: 11/15/2020 CLINICAL DATA:  Left lower extremity pain EXAM: LEFT LOWER EXTREMITY VENOUS DOPPLER ULTRASOUND TECHNIQUE: Gray-scale sonography with compression, as well as color and duplex ultrasound, were performed to evaluate the deep venous system(s) from the level of the common femoral vein through the popliteal and proximal calf veins. COMPARISON:  Contralateral right lower  extremity Doppler ultrasound 01/05/2019 FINDINGS: VENOUS Normal compressibility of the common femoral, superficial femoral, and popliteal veins, as well as the visualized calf veins. Visualized portions of profunda femoral vein and great saphenous vein unremarkable. No filling defects to suggest DVT on grayscale or color Doppler imaging. Doppler waveforms show normal direction of venous flow, normal respiratory plasticity and response to augmentation. Limited views of the contralateral common femoral vein are unremarkable. OTHER None. Limitations: none IMPRESSION: No femoropopliteal DVT nor evidence of DVT within the visualized calf veins. If clinical symptoms are inconsistent or if there are persistent or worsening symptoms, further imaging (possibly involving the iliac veins) may be warranted. Electronically Signed   By: Kreg Shropshire M.D.   On: 11/15/2020 03:00    Procedures Procedures   Medications Ordered in ED Medications - No data to display  ED Course  I have reviewed the triage vital signs and the nursing notes.  Pertinent labs & imaging results that were available during my care of the patient were reviewed by me and considered in my medical decision making (see chart for details).    MDM Rules/Calculators/A&P                         patient here for evaluation of left calf pain, has a history of DVT. Vascular ultrasound is negative for DVT on preliminary report. No evidence of acute infectious process. Discussed with patient home care for calf pain. Discussed PCP follow-up and return precautions. Also discussed with patient her hypertension in the emergency department, she states that she is anxious. Discussed rechecking her blood pressure at home and following up with her PCP regarding this.    Final Clinical Impression(s) / ED Diagnoses Final diagnoses:  Left leg pain    Rx / DC Orders ED Discharge Orders    None       Tilden Fossa, MD 11/15/20 0330

## 2020-11-15 NOTE — Discharge Instructions (Addendum)
The cause of your leg pain is not clear today.  You can take tylenol or over the counter Voltaren gel according to label instructions as needed for pain.  Get rechecked if you develop new or concerning symptoms.

## 2020-11-20 ENCOUNTER — Ambulatory Visit (INDEPENDENT_AMBULATORY_CARE_PROVIDER_SITE_OTHER): Payer: BC Managed Care – PPO | Admitting: Psychology

## 2020-11-20 DIAGNOSIS — F419 Anxiety disorder, unspecified: Secondary | ICD-10-CM

## 2020-11-21 ENCOUNTER — Other Ambulatory Visit: Payer: BC Managed Care – PPO

## 2020-11-23 ENCOUNTER — Other Ambulatory Visit: Payer: BC Managed Care – PPO

## 2020-12-07 ENCOUNTER — Ambulatory Visit (INDEPENDENT_AMBULATORY_CARE_PROVIDER_SITE_OTHER): Payer: BC Managed Care – PPO | Admitting: Psychology

## 2020-12-07 DIAGNOSIS — F419 Anxiety disorder, unspecified: Secondary | ICD-10-CM

## 2020-12-24 ENCOUNTER — Other Ambulatory Visit: Payer: Self-pay

## 2020-12-24 ENCOUNTER — Encounter (HOSPITAL_BASED_OUTPATIENT_CLINIC_OR_DEPARTMENT_OTHER): Payer: Self-pay | Admitting: *Deleted

## 2020-12-24 ENCOUNTER — Emergency Department (HOSPITAL_BASED_OUTPATIENT_CLINIC_OR_DEPARTMENT_OTHER)
Admission: EM | Admit: 2020-12-24 | Discharge: 2020-12-25 | Disposition: A | Discharge status: Home / Self Care | Payer: BC Managed Care – PPO | Attending: Emergency Medicine | Admitting: Emergency Medicine

## 2020-12-24 ENCOUNTER — Emergency Department (HOSPITAL_BASED_OUTPATIENT_CLINIC_OR_DEPARTMENT_OTHER): Payer: BC Managed Care – PPO

## 2020-12-24 DIAGNOSIS — Z7901 Long term (current) use of anticoagulants: Secondary | ICD-10-CM | POA: Insufficient documentation

## 2020-12-24 DIAGNOSIS — E119 Type 2 diabetes mellitus without complications: Secondary | ICD-10-CM | POA: Insufficient documentation

## 2020-12-24 DIAGNOSIS — R002 Palpitations: Secondary | ICD-10-CM | POA: Diagnosis present

## 2020-12-24 DIAGNOSIS — Z79899 Other long term (current) drug therapy: Secondary | ICD-10-CM | POA: Diagnosis not present

## 2020-12-24 DIAGNOSIS — I10 Essential (primary) hypertension: Secondary | ICD-10-CM | POA: Diagnosis not present

## 2020-12-24 DIAGNOSIS — Z8616 Personal history of COVID-19: Secondary | ICD-10-CM | POA: Insufficient documentation

## 2020-12-24 DIAGNOSIS — R42 Dizziness and giddiness: Secondary | ICD-10-CM | POA: Insufficient documentation

## 2020-12-24 DIAGNOSIS — R519 Headache, unspecified: Secondary | ICD-10-CM | POA: Insufficient documentation

## 2020-12-24 DIAGNOSIS — Z7984 Long term (current) use of oral hypoglycemic drugs: Secondary | ICD-10-CM | POA: Diagnosis not present

## 2020-12-24 DIAGNOSIS — R000 Tachycardia, unspecified: Secondary | ICD-10-CM | POA: Diagnosis not present

## 2020-12-24 DIAGNOSIS — J029 Acute pharyngitis, unspecified: Secondary | ICD-10-CM

## 2020-12-24 LAB — CBC WITH DIFFERENTIAL/PLATELET
Abs Immature Granulocytes: 0.04 10*3/uL (ref 0.00–0.07)
Basophils Absolute: 0.1 10*3/uL (ref 0.0–0.1)
Basophils Relative: 1 %
Eosinophils Absolute: 0.1 10*3/uL (ref 0.0–0.5)
Eosinophils Relative: 1 %
HCT: 38.4 % (ref 36.0–46.0)
Hemoglobin: 12.8 g/dL (ref 12.0–15.0)
Immature Granulocytes: 0 %
Lymphocytes Relative: 18 %
Lymphs Abs: 2.3 10*3/uL (ref 0.7–4.0)
MCH: 26.1 pg (ref 26.0–34.0)
MCHC: 33.3 g/dL (ref 30.0–36.0)
MCV: 78.4 fL — ABNORMAL LOW (ref 80.0–100.0)
Monocytes Absolute: 0.8 10*3/uL (ref 0.1–1.0)
Monocytes Relative: 6 %
Neutro Abs: 9.4 10*3/uL — ABNORMAL HIGH (ref 1.7–7.7)
Neutrophils Relative %: 74 %
Platelets: 408 10*3/uL — ABNORMAL HIGH (ref 150–400)
RBC: 4.9 MIL/uL (ref 3.87–5.11)
RDW: 15 % (ref 11.5–15.5)
WBC: 12.6 10*3/uL — ABNORMAL HIGH (ref 4.0–10.5)
nRBC: 0 % (ref 0.0–0.2)

## 2020-12-24 LAB — COMPREHENSIVE METABOLIC PANEL
ALT: 27 U/L (ref 0–44)
AST: 24 U/L (ref 15–41)
Albumin: 3.7 g/dL (ref 3.5–5.0)
Alkaline Phosphatase: 55 U/L (ref 38–126)
Anion gap: 8 (ref 5–15)
BUN: 8 mg/dL (ref 6–20)
CO2: 25 mmol/L (ref 22–32)
Calcium: 8.8 mg/dL — ABNORMAL LOW (ref 8.9–10.3)
Chloride: 102 mmol/L (ref 98–111)
Creatinine, Ser: 0.91 mg/dL (ref 0.44–1.00)
GFR, Estimated: >60 mL/min (ref >60)
Glucose, Bld: 116 mg/dL — ABNORMAL HIGH (ref 70–99)
Potassium: 3.3 mmol/L — ABNORMAL LOW (ref 3.5–5.1)
Sodium: 135 mmol/L (ref 135–145)
Total Bilirubin: 0.7 mg/dL (ref 0.3–1.2)
Total Protein: 7.6 g/dL (ref 6.5–8.1)

## 2020-12-24 LAB — PREGNANCY, URINE: Preg Test, Ur: NEGATIVE

## 2020-12-24 LAB — GROUP A STREP BY PCR: Group A Strep by PCR: NOT DETECTED

## 2020-12-24 MED ORDER — SODIUM CHLORIDE 0.9 % IV BOLUS
1000.0000 mL | Freq: Once | INTRAVENOUS | Status: AC
  Administered 2020-12-25: 1000 mL via INTRAVENOUS

## 2020-12-24 MED ORDER — LACTATED RINGERS IV BOLUS
1000.0000 mL | Freq: Once | INTRAVENOUS | Status: AC
  Administered 2020-12-24: 1000 mL via INTRAVENOUS

## 2020-12-24 MED ORDER — POTASSIUM CHLORIDE CRYS ER 20 MEQ PO TBCR
40.0000 meq | EXTENDED_RELEASE_TABLET | Freq: Once | ORAL | Status: AC
  Administered 2020-12-24: 40 meq via ORAL
  Filled 2020-12-24: qty 2

## 2020-12-24 MED ORDER — MAGNESIUM SULFATE 2 GM/50ML IV SOLN
2.0000 g | Freq: Once | INTRAVENOUS | Status: AC
  Administered 2020-12-25: 2 g via INTRAVENOUS
  Filled 2020-12-24: qty 50

## 2020-12-24 NOTE — ED Triage Notes (Addendum)
Pt reports palpitations. Hx of PVCs "usually stress related". Dr. Anitra Lauth assessing pt during triage. Reports headache and soret hroat. She had covid in April this year

## 2020-12-24 NOTE — ED Provider Notes (Signed)
[facility] EMERGENCY DEPARTMENT Provider Note   CSN: [id number] Arrival date & time: 12/24/20  2212     History Chief Complaint  Patient presents with   Palpitations    Marissa Hansen is a 30 y.o. female.  Patient is a 30 year old female with a history of diabetes, hypertension and prior PE/DVT in 2020 on Eliquis who is presenting today with several complaints.  Patient reports that on Tuesday Wednesday Thursday of this week she was working [employer] and noticed persistent uncomfortable headache that would not go away with Tylenol which she had never really experienced before.  She was feeling generally fatigued but on Friday she had a day off and noticed that the headache resolved.  She then went back to work on Saturday and today and started noting seeing yesterday that she was not feeling 100% and today the headache came back and she started to experience palpitations.  She did have some lightheadedness associated with palpitations and the headache has returned but it is not as severe as it was.  She has not had shortness of breath, cough or chest pain.  No abdominal pain, vomiting or diarrhea.  She also noticed today that she was getting a sore throat but has not had any fever.  She did have COVID in April but did not have lasting sequela from it.  She has had no recent medication changes.  She has not missed any of her Eliquis.  She does work as a Administrator, [employer] and does come in contact with dogs and just recently pulled a tick off a dog but has not had a tick bite that she is aware of.  The history is provided by the patient.  Palpitations Palpitations quality:  Irregular Onset quality:  Insidious Duration:  1 day Timing:  Constant Progression:  Waxing and waning Chronicity:  Recurrent Context comment:  Thought it was associated with stress Relieved by:  None tried Worsened by:  Stress Ineffective treatments:  None tried Associated symptoms: dizziness   Associated symptoms:  no back pain, no chest pain, no cough, no diaphoresis, no lower extremity edema, no shortness of breath and no vomiting   Risk factors: diabetes mellitus and hx of PE   Risk factors: no hx of atrial fibrillation, no hx of DVT and no hx of thyroid disease       Past Medical History:  Diagnosis Date   Diabetes mellitus without complication (HCC)    DVT (deep venous thrombosis) (HCC)    Hypertension    PE (pulmonary thromboembolism) (HCC)     Patient Active Problem List   Diagnosis Date Noted   Chest pain of uncertain etiology 08/14/2020   Essential hypertension 08/14/2020   Cervical dysplasia, mild 09/20/2014   Irregular periods/menstrual cycles 09/07/2014    No past surgical history on file.   OB History   No obstetric history on file.     Family History  Problem Relation Age of Onset   Hypertension Mother    Diabetes Maternal Aunt     Social History   Tobacco Use   Smoking status: Never   Smokeless tobacco: Never  Vaping Use   Vaping Use: Never used  Substance Use Topics   Alcohol use: Yes    Alcohol/week: 0.0 standard drinks    Comment: occ   Drug use: No    Home Medications Prior to Admission medications   Medication Sig Start Date End Date Taking? Authorizing Provider  amLODipine (NORVASC) 2.5 MG tablet Take 2.5 mg  by mouth daily.    [provider]  apixaban (ELIQUIS) 5 MG TABS tablet Take 5 mg by mouth in the morning and at bedtime.    [provider]  levocetirizine (XYZAL) 5 MG tablet Take 1 tablet by mouth as needed.    [provider]  metFORMIN (GLUCOPHAGE-XR) 500 MG 24 hr tablet Take 500 mg by mouth daily. 06/20/20   [provider]    Allergies    Patient has no known allergies.  Review of Systems   Review of Systems  Constitutional:  Negative for diaphoresis.  Respiratory:  Negative for cough and shortness of breath.   Cardiovascular:  Positive for palpitations. Negative for chest pain.   Gastrointestinal:  Negative for vomiting.  Musculoskeletal:  Negative for back pain.  Neurological:  Positive for dizziness.  All other systems reviewed and are negative.  Physical Exam Updated Vital Signs Pulse (!) 142   Ht 5\' 11"  (1.803 m)   Wt 136.1 kg   SpO2 100%   BMI 41.84 kg/m   Physical Exam Vitals and nursing note reviewed.  Constitutional:      General: She is not in acute distress.    Appearance: She is well-developed.  HENT:     Head: Normocephalic and atraumatic.     Nose: Nose normal.     Mouth/Throat:     Mouth: Mucous membranes are dry.     Pharynx: Posterior oropharyngeal erythema present. No oropharyngeal exudate.  Eyes:     Conjunctiva/sclera: Conjunctivae normal.     Pupils: Pupils are equal, round, and reactive to light.  Cardiovascular:     Rate and Rhythm: Regular rhythm. Tachycardia present. FrequentExtrasystoles are present.    Heart sounds: No murmur heard. Pulmonary:     Effort: Pulmonary effort is normal. No respiratory distress.     Breath sounds: Normal breath sounds. No wheezing or rales.  Abdominal:     General: There is no distension.     Palpations: Abdomen is soft.     Tenderness: There is no abdominal tenderness. There is no guarding or rebound.  Musculoskeletal:        General: No tenderness. Normal range of motion.     Cervical back: Normal range of motion and neck supple. No tenderness. No spinous process tenderness or muscular tenderness.     Right lower leg: No edema.     Left lower leg: No edema.  Skin:    General: Skin is warm and dry.     Findings: No erythema or rash.  Neurological:     Mental Status: She is alert and oriented to person, place, and time. Mental status is at baseline.  Psychiatric:        Mood and Affect: Mood normal.        Behavior: Behavior normal.    ED Results / Procedures / Treatments   Labs (all labs ordered are listed, but only abnormal results are displayed) Labs Reviewed  GROUP A STREP BY  PCR  CBC WITH DIFFERENTIAL/PLATELET  COMPREHENSIVE METABOLIC PANEL  PREGNANCY, URINE  TROPONIN I (HIGH SENSITIVITY)    EKG EKG Interpretation  Date/Time:  Sunday December 24 2020 22:23:30 EDT Ventricular Rate:  142 PR Interval:  113 QRS Duration: 91 QT Interval:  286 QTC Calculation: 440 R Axis:   26 Text Interpretation: Sinus tachycardia new Ventricular trigeminy Nonspecific repol abnormality, diffuse leads Confirmed by 12-30-1981 (Gwyneth Sprout) on 12/24/2020 10:28:11 PM  Radiology No results found.  Procedures Procedures   Medications Ordered  in ED Medications  lactated ringers bolus 1,000 mL (has no administration in time range)    ED Course  I have reviewed the triage vital signs and the nursing notes.  Pertinent labs & imaging results that were available during my care of the patient were reviewed by me and considered in my medical decision making (see chart for details).    MDM Rules/Calculators/A&P                          Patient is a pleasant 30 year old female presenting today with palpitations and feeling lightheaded.  She has been working a lot recently and has had significant stress at work and felt like the palpitations might be a result of that.  She does report on Tuesday through Thursday she had significant headache that would not go away with Tylenol but it did resolve on Friday when she had a day off.  Yesterday she did not feel 100% but today is when she started noticing a mild sore throat and the palpitations.  Patient has no chest pain or shortness of breath.  She has had palpitations intermittently and was supposed to see a cardiologist in February of this year and wear a monitor and get an echo however she kept having to cancel the appointment and never followed up.  She is taking her Eliquis every day and has not missed any doses.  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  Patient did have COVID in April.  She is well-appearing on exam but is tachycardic to the 140s with EKG showing sinus tachycardia with PVCs.  Low suspicion at this time for dissection or PE given patient's complaints.  She has had poor appetite recently and she could be having a viral illness but does not displaying any signs concerning for meningitis.  She is around animals regularly and has not been aware of a tick bite but is around ticks regularly due to involvement with the animals.  Labs are pending.  Patient given IV fluids.  Also given sore throat and erythema will get strep as well.   Final Clinical Impression(s) / ED Diagnoses Final diagnoses:  None    Rx / DC Orders ED Discharge Orders     None        Gwyneth Sprout, MD 12/24/20 2254

## 2020-12-24 NOTE — ED Notes (Signed)
Ambulated to restroom without difficulty.

## 2020-12-25 MED ORDER — DOXYCYCLINE HYCLATE 100 MG PO CAPS: 100.0000 mg | ORAL_CAPSULE | Freq: Two times a day (BID) | ORAL | 0 refills | Status: AC

## 2020-12-25 MED ORDER — DOXYCYCLINE HYCLATE 100 MG PO TABS
100.0000 mg | ORAL_TABLET | Freq: Once | ORAL | Status: AC
  Administered 2020-12-25: 100 mg via ORAL
  Filled 2020-12-25: qty 1

## 2020-12-25 NOTE — Discharge Instructions (Addendum)
You were evaluated in the Emergency Department and after careful evaluation, we did not find any emergent condition requiring admission or further testing in the hospital.  Your exam/testing today was overall reassuring.  Your symptoms may be due to a viral illness.  We would like you to take the doxycycline antibiotic to cover for any tickborne illness.  Please return to the Emergency Department if you experience any worsening of your condition.  Thank you for allowing [place] to be a part of your care.

## 2020-12-25 NOTE — ED Provider Notes (Signed)
  Provider Note MRN:  [id number]  Arrival date & time: 12/25/20    ED Course and Medical Decision Making  Assumed care from Dr. Anitra Lauth at shift change.  Recent headache that is now resolved, sore throat, tachycardia, palpitations.  History of PE but very compliant with anticoagulation, is adamant she has not missed a dose.  No evidence of DVT, no chest pain, no shortness of breath, no hypoxia, highly doubt PE.  Seems more likely to be an infectious process.  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  On my initial evaluation patient remains tachycardic, provided additional liter of IV fluids, magnesium and potassium repleted.  On repeat evaluation tachycardia is resolved, patient feeling well and requesting discharge.  Will provide prescription for doxycycline, strict return precautions.  Procedures  Final Clinical Impressions(s) / ED Diagnoses     ICD-10-CM   1. Heart palpitations  R00.2     2. Sore throat  J02.9     3. Nonintractable headache, unspecified chronicity pattern, unspecified headache type  R51.9       ED Discharge Orders          Ordered    doxycycline (VIBRAMYCIN) 100 MG capsule  2 times daily        12/25/20 0126              Discharge Instructions      You were evaluated in the Emergency Department and after careful evaluation, we did not find any emergent condition requiring admission or further testing in the hospital.  Your exam/testing today was overall reassuring.  Your symptoms may be due to a viral illness.  We would like you to take the doxycycline antibiotic to cover for any tickborne illness.  Please return to the Emergency Department if you experience any worsening of your condition.  Thank you for allowing [place] to be a part of your care.     Elmer Sow. Pilar Plate, MD [facility] Emergency Medicine [facility] [email or web address]    Sabas Sous, MD 12/25/20 [phone]

## 2021-01-25 ENCOUNTER — Other Ambulatory Visit: Payer: Self-pay

## 2021-01-25 ENCOUNTER — Ambulatory Visit: Payer: BC Managed Care – PPO

## 2021-01-25 DIAGNOSIS — I10 Essential (primary) hypertension: Secondary | ICD-10-CM

## 2021-02-12 ENCOUNTER — Ambulatory Visit: Payer: BC Managed Care – PPO | Admitting: Cardiology

## 2021-02-13 NOTE — Progress Notes (Signed)
Called and spoke with patient regarding her echocardiogram results.

## 2021-04-15 ENCOUNTER — Emergency Department (HOSPITAL_BASED_OUTPATIENT_CLINIC_OR_DEPARTMENT_OTHER)
Admission: EM | Admit: 2021-04-15 | Discharge: 2021-04-15 | Disposition: A | Discharge status: Home / Self Care | Payer: BC Managed Care – PPO | Attending: Emergency Medicine | Admitting: Emergency Medicine

## 2021-04-15 ENCOUNTER — Encounter (HOSPITAL_BASED_OUTPATIENT_CLINIC_OR_DEPARTMENT_OTHER): Payer: Self-pay

## 2021-04-15 DIAGNOSIS — E119 Type 2 diabetes mellitus without complications: Secondary | ICD-10-CM | POA: Insufficient documentation

## 2021-04-15 DIAGNOSIS — J029 Acute pharyngitis, unspecified: Secondary | ICD-10-CM | POA: Diagnosis present

## 2021-04-15 DIAGNOSIS — I10 Essential (primary) hypertension: Secondary | ICD-10-CM | POA: Diagnosis not present

## 2021-04-15 DIAGNOSIS — Z7901 Long term (current) use of anticoagulants: Secondary | ICD-10-CM | POA: Diagnosis not present

## 2021-04-15 DIAGNOSIS — Z7984 Long term (current) use of oral hypoglycemic drugs: Secondary | ICD-10-CM | POA: Diagnosis not present

## 2021-04-15 DIAGNOSIS — K122 Cellulitis and abscess of mouth: Secondary | ICD-10-CM | POA: Insufficient documentation

## 2021-04-15 DIAGNOSIS — Z79899 Other long term (current) drug therapy: Secondary | ICD-10-CM | POA: Diagnosis not present

## 2021-04-15 LAB — GROUP A STREP BY PCR: Group A Strep by PCR: NOT DETECTED

## 2021-04-15 MED ORDER — LIDOCAINE VISCOUS HCL 2 % MT SOLN: 5.0000 mL | Freq: Four times a day (QID) | OROMUCOSAL | 0 refills | Status: DC | PRN

## 2021-04-15 MED ORDER — IBUPROFEN 100 MG/5ML PO SUSP
400.0000 mg | Freq: Once | ORAL | Status: AC
  Administered 2021-04-15: 400 mg via ORAL
  Filled 2021-04-15: qty 20

## 2021-04-15 MED ORDER — DEXAMETHASONE SODIUM PHOSPHATE 10 MG/ML IJ SOLN
10.0000 mg | Freq: Once | INTRAMUSCULAR | Status: AC
  Administered 2021-04-15: 10 mg
  Filled 2021-04-15: qty 1

## 2021-04-15 NOTE — ED Provider Notes (Signed)
[facility] EMERGENCY DEPT Provider Note   CSN: [id number] Arrival date & time: 04/15/21  1911     History Chief Complaint  Patient presents with   Sore Throat    Tongue    Marissa Hansen is a 30 y.o. female.  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 Pain improved drinking water and using lozenges. Pain is worse with talking. Denies cough, congestion, sick contacts.       Past Medical History:  Diagnosis Date   Diabetes mellitus without complication (HCC)    DVT (deep venous thrombosis) (HCC)    Hypertension    PE (pulmonary thromboembolism) [facility])     Patient Active Problem List   Diagnosis Date Noted   Chest pain of uncertain etiology 08/14/2020   Essential hypertension 08/14/2020   Cervical dysplasia, mild 09/20/2014   Irregular periods/menstrual cycles 09/07/2014    History reviewed. No pertinent surgical history.   OB History   No obstetric history on file.     Family History  Problem Relation Age of Onset   Hypertension Mother    Diabetes Maternal Aunt     Social History   Tobacco Use   Smoking status: Never   Smokeless tobacco: Never  Vaping Use   Vaping Use: Never used  Substance Use Topics   Alcohol use: Yes    Alcohol/week: 0.0 standard drinks    Comment: occ   Drug use: No    Home Medications Prior to Admission medications   Medication Sig Start Date End Date Taking? Authorizing Provider  magic mouthwash (lidocaine, diphenhydrAMINE, alum & mag hydroxide) suspension Swish and spit 5 mLs 4 (four) times daily as needed for mouth pain. 04/15/21  Yes Jeannie Fend, PA-C  amLODipine (NORVASC) 2.5 MG tablet Take 2.5 mg by mouth daily.    [provider]  apixaban (ELIQUIS) 5 MG TABS tablet Take 5 mg by mouth in the morning and at bedtime.    [provider]  levocetirizine (XYZAL) 5 MG tablet Take 1 tablet by mouth  as needed.    [provider]  metFORMIN (GLUCOPHAGE-XR) 500 MG 24 hr tablet Take 500 mg by mouth daily. 06/20/20   [provider]    Allergies    Patient has no known allergies.  Review of Systems   Review of Systems  Constitutional:  Negative for chills and fever.  HENT:  Positive for sore throat. Negative for congestion, ear pain and trouble swallowing.   Respiratory:  Negative for cough.   Gastrointestinal:  Negative for nausea and vomiting.  Musculoskeletal:  Negative for arthralgias and myalgias.  Skin:  Negative for rash and wound.  Allergic/Immunologic: Negative for immunocompromised state.  Neurological:  Negative for headaches.  Hematological:  Negative for adenopathy.  Psychiatric/Behavioral:  Negative for confusion.   All other systems reviewed and are negative.  Physical Exam Updated Vital Signs BP (!) 161/90 (BP Location: Right Arm)   Pulse 87   Temp 98.1 F (36.7 C) (Oral)   Resp 17   Ht 5\' 11"  (1.803 m)   Wt 136.1 kg   SpO2 100%   BMI 41.84 kg/m   Physical Exam Vitals and nursing note reviewed.  Constitutional:      General: She is not in acute distress.    Appearance: She is well-developed. She is not diaphoretic.  HENT:     Head: Normocephalic and  atraumatic.     Right Ear: Tympanic membrane and ear canal normal.     Left Ear: Tympanic membrane and ear canal normal.     Mouth/Throat:     Mouth: Mucous membranes are moist.     Pharynx: Uvula midline. Uvula swelling present. No pharyngeal swelling, oropharyngeal exudate or posterior oropharyngeal erythema.     Tonsils: No tonsillar exudate or tonsillar abscesses. 1+ on the right. 1+ on the left.  Eyes:     Conjunctiva/sclera: Conjunctivae normal.  Cardiovascular:     Rate and Rhythm: Normal rate and regular rhythm.     Heart sounds: Normal heart sounds. No murmur heard. Pulmonary:     Effort: Pulmonary effort is normal.     Breath sounds: Normal breath sounds.  Skin:     General: Skin is warm and dry.     Findings: No erythema or rash.  Neurological:     Mental Status: She is alert and oriented to person, place, and time.  Psychiatric:        Behavior: Behavior normal.    ED Results / Procedures / Treatments   Labs (all labs ordered are listed, but only abnormal results are displayed) Labs Reviewed  GROUP A STREP BY PCR    EKG None  Radiology No results found.  Procedures Procedures   Medications Ordered in ED Medications  dexamethasone (DECADRON) injection 10 mg (has no administration in time range)  ibuprofen (ADVIL) 100 MG/5ML suspension 400 mg (has no administration in time range)    ED Course  I have reviewed the triage vital signs and the nursing notes.  Pertinent labs & imaging results that were available during my care of the patient were reviewed by me and considered in my medical decision making (see chart for details).  Clinical Course as of 04/15/21 2104  Wynelle Link Apr 15, 2021  [id number] 30 year old female with complaint of sore throat for the past week.  On exam, uvula is mildly edematous, exam otherwise unremarkable.  Strep test is negative.  Vitals reviewed, blood pressure mildly elevated, O2 sat 100% room air. Patient was given dose of Decadron and ibuprofen in the emergency room.  Prescription for Magic mouthwash to use as needed for comfort.  Recommend recheck with PCP in the coming week if not improving. [LM]    Clinical Course User Index [LM] Alden Hipp   MDM Rules/Calculators/A&P                           Final Clinical Impression(s) / ED Diagnoses Final diagnoses:  Uvulitis    Rx / DC Orders ED Discharge Orders          Ordered    magic mouthwash (lidocaine, diphenhydrAMINE, alum & mag hydroxide) suspension  4 times daily PRN        04/15/21 2103             Jeannie Fend, PA-C 04/15/21 2104    Rolan Bucco, MD 04/15/21 2319

## 2021-04-15 NOTE — ED Triage Notes (Signed)
Pt states, "It feels like something is sticking out on the back on my tongue" and reports that there is intermittent pain x one week. Pt describes the sensation as a "bristle brush or sharp" on the back of her tongue and located in the one area. Denies difficulty swallowing. Pt states pain improves after drinking fluids.

## 2021-04-15 NOTE — Discharge Instructions (Addendum)
Home to rest and hydrate.  Recommend children's liquid Motrin and Tylenol as needed as directed for pain. Magic mouthwash gargle and spit as needed as prescribed. Recheck with your doctor Friday if not improving.

## 2021-04-16 ENCOUNTER — Telehealth (HOSPITAL_BASED_OUTPATIENT_CLINIC_OR_DEPARTMENT_OTHER): Payer: Self-pay | Admitting: Emergency Medicine

## 2021-04-16 MED ORDER — MAGIC MOUTHWASH: 5.0000 mL | Freq: Four times a day (QID) | ORAL | 0 refills | Status: DC | PRN

## 2021-04-16 NOTE — Telephone Encounter (Signed)
Send magic mouth wash to [facility]'t [facility] the compound to formulate it. Send the rx to [address]. Called patient at [phone] and left a VM about the change.

## 2021-06-13 ENCOUNTER — Ambulatory Visit: Payer: Federal, State, Local not specified - PPO | Admitting: Podiatry

## 2021-06-13 ENCOUNTER — Other Ambulatory Visit: Payer: Self-pay

## 2021-06-13 ENCOUNTER — Ambulatory Visit: Payer: BC Managed Care – PPO | Admitting: Podiatry

## 2021-06-13 ENCOUNTER — Ambulatory Visit (INDEPENDENT_AMBULATORY_CARE_PROVIDER_SITE_OTHER): Payer: BC Managed Care – PPO

## 2021-06-13 DIAGNOSIS — M722 Plantar fascial fibromatosis: Secondary | ICD-10-CM

## 2021-06-13 HISTORY — DX: Plantar fascial fibromatosis: M72.2

## 2021-06-13 MED ORDER — TRIAMCINOLONE ACETONIDE 10 MG/ML IJ SUSP
10.0000 mg | Freq: Once | INTRAMUSCULAR | Status: AC
  Administered 2021-06-13: 10 mg

## 2021-06-13 NOTE — Progress Notes (Signed)
Subjective:   Patient ID: Marissa Hansen, female   DOB: 30 y.o.   MRN: [id number]   HPI Patient states she is trying to get more active and has been losing weight with a goal to lose another 100 pounds.  Patient stated that she started wearing Hoka's and developed pain in her heel after wearing Hoka's for an extended period of time.  Patient is active on cement floors and does not smoke   Review of Systems  All other systems reviewed and are negative.      Objective:  Physical Exam Vitals and nursing note reviewed.  Constitutional:      Appearance: She is well-developed.  Pulmonary:     Effort: Pulmonary effort is normal.  Musculoskeletal:        General: Normal range of motion.  Skin:    General: Skin is warm.  Neurological:     Mental Status: She is alert.    Neurovascular status intact muscle strength found to be adequate range of motion adequate with significant flatfoot deformity noted bilateral.  Patient is found to have exquisite discomfort in the plantar fascia left over right with inflammation fluid of the medial band and is found to have good digital perfusion well oriented x3 with mild equinus condition.     Assessment:  Acute plantar fasciitis with patient who is trying to maintain activity with weight loss and flatfoot structural issues with tendinitis-like symptoms      Plan:  H&P reviewed all conditions and educated her.  Today went ahead did sterile prep and injected the medial band of the plantar fascia 3 mg Kenalog 5 mg Xylocaine and applied fascial brace to lift up the arch along with shoe gear modifications and physical therapy.  Discussed orthotics which I think will be of benefit to her and we will see her back in 2 weeks to confirm that were getting good response to the acute situation  X-rays indicate flattening of the arch Serpe small spur no indication stress fracture arthritis

## 2021-06-13 NOTE — Patient Instructions (Signed)

## 2021-06-29 ENCOUNTER — Ambulatory Visit: Payer: BC Managed Care – PPO | Admitting: Podiatry

## 2021-10-04 ENCOUNTER — Other Ambulatory Visit: Payer: Self-pay

## 2021-10-04 ENCOUNTER — Ambulatory Visit (INDEPENDENT_AMBULATORY_CARE_PROVIDER_SITE_OTHER): Payer: BC Managed Care – PPO | Admitting: Obstetrics & Gynecology

## 2021-10-04 ENCOUNTER — Encounter (HOSPITAL_BASED_OUTPATIENT_CLINIC_OR_DEPARTMENT_OTHER): Payer: Self-pay

## 2021-10-04 VITALS — BP 134/80 | HR 80 | Ht 70.0 in | Wt 274.0 lb

## 2021-10-04 DIAGNOSIS — Z7901 Long term (current) use of anticoagulants: Secondary | ICD-10-CM

## 2021-10-04 DIAGNOSIS — O0991 Supervision of high risk pregnancy, unspecified, first trimester: Secondary | ICD-10-CM

## 2021-10-04 DIAGNOSIS — Z86711 Personal history of pulmonary embolism: Secondary | ICD-10-CM | POA: Diagnosis not present

## 2021-10-04 DIAGNOSIS — Z3201 Encounter for pregnancy test, result positive: Secondary | ICD-10-CM

## 2021-10-04 LAB — CBC
Hematocrit: 39.2 % (ref 34.0–46.6)
Hemoglobin: 12.9 g/dL (ref 11.1–15.9)
MCH: 26.1 pg — ABNORMAL LOW (ref 26.6–33.0)
MCHC: 32.9 g/dL (ref 31.5–35.7)
MCV: 79 fL (ref 79–97)
Platelets: 432 10*3/uL (ref 150–450)
RBC: 4.94 x10E6/uL (ref 3.77–5.28)
RDW: 15.5 % — ABNORMAL HIGH (ref 11.7–15.4)
WBC: 11.8 10*3/uL — ABNORMAL HIGH (ref 3.4–10.8)

## 2021-10-04 LAB — POCT URINE PREGNANCY: Preg Test, Ur: POSITIVE — AB

## 2021-10-04 MED ORDER — ENOXAPARIN SODIUM 120 MG/0.8ML IJ SOSY: 120.0000 mg | PREFILLED_SYRINGE | INTRAMUSCULAR | 2 refills | Status: DC

## 2021-10-04 NOTE — Progress Notes (Signed)
GYNECOLOGY  VISIT ? ?CC:   positive pregnancy test at home ? ?HPI: ?31 y.o. G1P0 Single Black or [place] American female here for h/o positive pregnancy test at home.  LMP 08/20/2020.  EGA 6 3/6 weeks.  Pt has hx of DVT and PE that occurred after driving two 12 hours trips separated by only a short amount of time.  She was evaluated and work-up was negative for coagulopathy.  She has been on Eliquis 5mg  BID.  Pt has been working on weight loss with a program with [facility] using Optifast.  She's lost about 60 pounds.  She wasn't trying to get pregnant.  Has been diagnosed with PCOS.  Is on Metformin for insulin resistance but not diabetes.  HbA1c 05/17/2021 was 5.2.  Lastly, pt's is 39.1.  Weight 124kg today.  ? ?Pt advised need to stop eliquis and transition to lovenox.  Had normal creatinine in 05/2021.  Will obtain CBC today. ? ?Patient Active Problem List  ? Diagnosis Date Noted  ? Chest pain of uncertain etiology 08/14/2020  ? Essential hypertension 08/14/2020  ? Cervical dysplasia, mild 09/20/2014  ? Irregular periods/menstrual cycles 09/07/2014  ? ? ?Past Medical History:  ?Diagnosis Date  ? DVT (deep venous thrombosis) (HCC) 2020  ? Hypertension   ? Insulin resistance   ? PE (pulmonary thromboembolism) (HCC) 2020  ? ? ?No past surgical history on file. ? ?MEDS:   ?Current Outpatient Medications on File Prior to Visit  ?Medication Sig Dispense Refill  ? amLODipine (NORVASC) 2.5 MG tablet Take 2.5 mg by mouth daily.    ? levocetirizine (XYZAL) 5 MG tablet Take 1 tablet by mouth as needed.    ? metFORMIN (GLUCOPHAGE-XR) 500 MG 24 hr tablet Take 500 mg by mouth daily.    ? valACYclovir (VALTREX) 1000 MG tablet SMARTSIG:1 Tablet(s) By Mouth Every 12 Hours    ? ?No current facility-administered medications on file prior to visit.  ? ? ?ALLERGIES: Patient has no known allergies. ? ?Family History  ?Problem Relation Age of Onset  ? Hypertension Mother   ? Diabetes Maternal Aunt   ? ? ?SH:  in long term relationship, non  smoker ? ?Review of Systems  ?All other systems reviewed and are negative. ? ?PHYSICAL EXAMINATION:   ? ?BP 134/80   Pulse 80   Ht 5\' 10"  (1.778 m)   Wt 274 lb (124.3 kg)   LMP 08/20/2020 (Exact Date)   BMI 39.31 kg/m?     ?General appearance: alert, cooperative and appears stated age ?No other exam performed today ? ?Assessment/Plan: ?1. Positive pregnancy test ?- pregnancy confirmed today ?- POCT urine pregnancy ? ?2. Supervision of high risk pregnancy in first trimester ?- advised pt start checking BPs twice weekly ?- will stay on amlodipine 5mg  daily for now ?- Babyscripts Schedule Optimization ? ?3. Chronic anticoagulation ?- will start Lovenox 120 mg daily.  Rx to pharmacy.  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 ?- CBC ? ? ? ?

## 2021-10-07 ENCOUNTER — Encounter (HOSPITAL_BASED_OUTPATIENT_CLINIC_OR_DEPARTMENT_OTHER): Payer: Self-pay | Admitting: Obstetrics & Gynecology

## 2021-10-23 ENCOUNTER — Telehealth (HOSPITAL_BASED_OUTPATIENT_CLINIC_OR_DEPARTMENT_OTHER): Payer: Self-pay | Admitting: *Deleted

## 2021-10-23 NOTE — Telephone Encounter (Signed)
Pt states that she experienced passing a small blood clot this morning. She states that she had some pink/brownish color discharge a couple of days ago. Advised that if she were to experience any additional bleeding this evening she could go to MAU for evaluation. Pt provided with appt for evaluation by provider tomorrow morning as well.  ?

## 2021-10-24 ENCOUNTER — Encounter (HOSPITAL_BASED_OUTPATIENT_CLINIC_OR_DEPARTMENT_OTHER): Payer: Self-pay | Admitting: Medical

## 2021-10-24 ENCOUNTER — Ambulatory Visit (INDEPENDENT_AMBULATORY_CARE_PROVIDER_SITE_OTHER): Payer: BC Managed Care – PPO | Admitting: Medical

## 2021-10-24 ENCOUNTER — Encounter (HOSPITAL_COMMUNITY): Payer: Self-pay | Admitting: Obstetrics & Gynecology

## 2021-10-24 ENCOUNTER — Inpatient Hospital Stay (HOSPITAL_COMMUNITY): Payer: BC Managed Care – PPO

## 2021-10-24 ENCOUNTER — Inpatient Hospital Stay (HOSPITAL_COMMUNITY)
Admission: AD | Admit: 2021-10-24 | Discharge: 2021-10-24 | Disposition: A | Discharge status: Home / Self Care | Payer: BC Managed Care – PPO | Attending: Obstetrics & Gynecology | Admitting: Obstetrics & Gynecology

## 2021-10-24 VITALS — BP 114/65 | HR 75 | Wt 279.2 lb

## 2021-10-24 DIAGNOSIS — Z7984 Long term (current) use of oral hypoglycemic drugs: Secondary | ICD-10-CM | POA: Diagnosis not present

## 2021-10-24 DIAGNOSIS — Z3A09 9 weeks gestation of pregnancy: Secondary | ICD-10-CM

## 2021-10-24 DIAGNOSIS — Z86711 Personal history of pulmonary embolism: Secondary | ICD-10-CM | POA: Diagnosis not present

## 2021-10-24 DIAGNOSIS — Z86718 Personal history of other venous thrombosis and embolism: Secondary | ICD-10-CM | POA: Diagnosis not present

## 2021-10-24 DIAGNOSIS — O209 Hemorrhage in early pregnancy, unspecified: Secondary | ICD-10-CM

## 2021-10-24 DIAGNOSIS — Z7901 Long term (current) use of anticoagulants: Secondary | ICD-10-CM | POA: Insufficient documentation

## 2021-10-24 DIAGNOSIS — O30031 Twin pregnancy, monochorionic/diamniotic, first trimester: Secondary | ICD-10-CM | POA: Diagnosis not present

## 2021-10-24 LAB — CBC
HCT: 39.9 % (ref 36.0–46.0)
Hemoglobin: 12.5 g/dL (ref 12.0–15.0)
MCH: 25.7 pg — ABNORMAL LOW (ref 26.0–34.0)
MCHC: 31.3 g/dL (ref 30.0–36.0)
MCV: 82.1 fL (ref 80.0–100.0)
Platelets: 409 10*3/uL — ABNORMAL HIGH (ref 150–400)
RBC: 4.86 MIL/uL (ref 3.87–5.11)
RDW: 15.7 % — ABNORMAL HIGH (ref 11.5–15.5)
WBC: 9.3 10*3/uL (ref 4.0–10.5)
nRBC: 0 % (ref 0.0–0.2)

## 2021-10-24 LAB — WET PREP, GENITAL
Clue Cells Wet Prep HPF POC: NONE SEEN
Sperm: NONE SEEN
Trich, Wet Prep: NONE SEEN
WBC, Wet Prep HPF POC: <10 (ref <10)
Yeast Wet Prep HPF POC: NONE SEEN

## 2021-10-24 LAB — ABO/RH: ABO/RH(D): B POS

## 2021-10-24 LAB — HCG, QUANTITATIVE, PREGNANCY: hCG, Beta Chain, Quant, S: 65197 m[IU]/mL — ABNORMAL HIGH (ref <5)

## 2021-10-24 NOTE — MAU Note (Signed)
.  Marissa Hansen is a 31 y.o. at [redacted]w[redacted]d here in MAU reporting: she passed a blood clot last night. Nop bleeding or cramping today. Went to [facility] office for u/s and was unable to"see what she needed t see".  ?LMP: 08/20/2321 ?Onset of complaint: last night ?Pain score: 0 ?There were no vitals filed for this visit.   ?FHT:n/a ?Lab orders placed from triage:   ?none ?

## 2021-10-24 NOTE — Progress Notes (Signed)
[name] is a 31 y.o. G1P0 at [redacted]w[redacted]d who presents today for evaluation of vaginal bleeding in the first trimester. The patient states she had noted very light pink spotting occasionally then last night while at work noted a quarter sized clot. She has had only spotting since then and denies pain or fever.  ? ?Pt informed that the ultrasound is considered a limited OB ultrasound and is not intended to be a complete ultrasound exam.  Patient also informed that the ultrasound is not being completed with the intent of assessing for fetal or placental anomalies or any pelvic abnormalities.  Explained that the purpose of today?s ultrasound is to assess for  viability.  Patient acknowledges the purpose of the exam and the limitations of the study.  Unable to visualize IUP with trans-abdominal US today.  ? ?Patient advised that she will need to present to MAU for further urgent work-up of bleeding in first trimester.  ? ?I have called the MAU to notify them of the patient.  ? ?If viable pregnancy is noted today then she will return as scheduled for her new OB visit next week.  ? ?Vonzella Nipple, PA-C ?10/24/2021 9:29 AM  ? ? ? ?

## 2021-10-24 NOTE — MAU Provider Note (Addendum)
?History  ?  ? ?CSN: [id number] ? ?Arrival date and time: 10/24/21 0953 ? ? Event Date/Time  ? First Provider Initiated Contact with Patient 10/24/21 1014   ?  ? ?Chief Complaint  ?Patient presents with  ? Vaginal Bleeding  ? ?Marissa Hansen is a 31 year old G1P0 patient currently at [redacted]w[redacted]d pregnant presenting with chief complaint of brown vaginal discharge. She was sent from [facility] due to limitations of bedside ultrasound. Patient states she has had very light pink vaginal spotting for a few days, and she states it is very light pink and only notices it when she wipes. She states she passed a quarter-sized clot 2 nights ago but has passed no clots since. She states she is still having occasional light pink discharge. She denies any pain. She endorses nausea which she has noted for the duration of her pregnancy. She denies new sexual partners and last intercourse was 4-5 days before spotting began.  ? ?Vaginal Bleeding ?The patient's pertinent negatives include no pelvic pain or vaginal discharge. Associated symptoms include nausea. Pertinent negatives include no abdominal pain, chills, constipation, diarrhea, dysuria, fever, frequency, hematuria, urgency or vomiting.  ? ?OB History   ? ? Gravida  ?1  ? Para  ?   ? Term  ?   ? Preterm  ?   ? AB  ?   ? Living  ?   ?  ? ? SAB  ?   ? IAB  ?   ? Ectopic  ?   ? Multiple  ?   ? Live Births  ?   ?   ?  ?  ? ? ?Past Medical History:  ?Diagnosis Date  ? DVT (deep venous thrombosis) (HCC) 2020  ? Hypertension   ? Insulin resistance   ? PE (pulmonary thromboembolism) (HCC) 2020  ? ? ?History reviewed. No pertinent surgical history. ? ?Family History  ?Adopted: Yes  ?Problem Relation Age of Onset  ? Hypertension Mother   ? Diabetes Maternal Aunt   ? ? ?Social History  ? ?Tobacco Use  ? Smoking status: Never  ? Smokeless tobacco: Never  ?Vaping Use  ? Vaping Use: Never used  ?Substance Use Topics  ? Alcohol use: Not Currently  ?  Comment: occ  ? Drug use: No   ? ? ?Allergies: No Known Allergies ? ?Medications Prior to Admission  ?Medication Sig Dispense Refill Last Dose  ? amLODipine (NORVASC) 2.5 MG tablet Take 2.5 mg by mouth daily.   10/24/2021  ? enoxaparin (LOVENOX) 120 MG/0.8ML injection Inject 0.8 mLs (120 mg total) into the skin daily. 30 mL 2 10/23/2021  ? levocetirizine (XYZAL) 5 MG tablet Take 1 tablet by mouth as needed.   10/24/2021  ? Prenatal Vit-Fe Fumarate-FA (PRENATAL MULTIVITAMIN) TABS tablet Take 1 tablet by mouth daily at 12 noon.     ? valACYclovir (VALTREX) 1000 MG tablet SMARTSIG:1 Tablet(s) By Mouth Every 12 Hours   10/24/2021  ? metFORMIN (GLUCOPHAGE-XR) 500 MG 24 hr tablet Take 500 mg by mouth daily.     ? ? ?Review of Systems  ?Constitutional:  Negative for chills, fatigue and fever.  ?Respiratory:  Negative for chest tightness and shortness of breath.   ?Cardiovascular:  Negative for chest pain and palpitations.  ?Gastrointestinal:  Positive for nausea. Negative for abdominal pain, constipation, diarrhea and vomiting.  ?Genitourinary:  Positive for vaginal bleeding. Negative for difficulty urinating, dysuria, frequency, hematuria, pelvic pain, urgency and vaginal discharge.  ?Physical Exam  ? ?  Blood pressure 126/79, pulse 86, temperature 98.4 ?F (36.9 ?C), resp. rate 18. ? ?Physical Exam ?Constitutional:   ?   General: She is not in acute distress. ?   Appearance: Normal appearance. She is not ill-appearing.  ?HENT:  ?   Head: Normocephalic and atraumatic.  ?   Right Ear: External ear normal.  ?   Left Ear: External ear normal.  ?   Nose: Nose normal.  ?Eyes:  ?   General:     ?   Right eye: No discharge.     ?   Left eye: No discharge.  ?Cardiovascular:  ?   Rate and Rhythm: Normal rate and regular rhythm.  ?   Pulses: Normal pulses.  ?   Heart sounds: Normal heart sounds. No murmur heard. ?  No friction rub. No gallop.  ?Pulmonary:  ?   Effort: Pulmonary effort is normal. No respiratory distress.  ?   Breath sounds: Normal breath sounds. No  stridor.  ?Abdominal:  ?   General: There is no distension.  ?   Palpations: Abdomen is soft.  ?   Tenderness: There is no abdominal tenderness. There is no guarding.  ?Musculoskeletal:  ?   Cervical back: Normal range of motion.  ?Skin: ?   General: Skin is warm and dry.  ?Neurological:  ?   General: No focal deficit present.  ?   Mental Status: She is alert and oriented to person, place, and time. Mental status is at baseline.  ?Psychiatric:     ?   Mood and Affect: Mood normal.     ?   Behavior: Behavior normal.  ? ? ?MAU Course  ?Procedures ? ?MDM ?CBC, TV U/S, ABO/Rh, hCG, wet prep, and GC/chlamydia ordered. TV U/S personally reviewed and suspicious for missed abortion. CBC and wet prep WNL. Patient's vitals are stable with no severe bleeding. ? ?Assessment and Plan  ?Twin gestation ?Vaginal bleeding ?-TV U/S revealed twin gestation with no fetal heartbeat noted. Recommended repeat ultrasound in 1 week. ?-CBC and wet prep WNL. ?-Discharge home. ?-Return precautions discussed. ?-Follow-up in 1 week for repeat ultrasound and discussion of next steps.  ? ?Marissa Hansen ?10/24/2021, 10:23 AM  ? ? ?CNM attestation: ? ?I have seen and examined this patient and agree with above documentation in the PA student's note.  ? ?Marissa Hansen is a 31 y.o. G1P0 at [redacted]w[redacted]d reporting vaginal spotting and passing 1 blood clot yesterday. Was sent from office for ultrasound. She currently denies any spotting or pain.  ? ?PE: ?Patient Vitals for the past 24 hrs: ? BP Temp Pulse Resp  ?10/24/21 1004 126/79 98.4 ?F (36.9 ?C) 86 18  ? ?Gen: calm comfortable, NAD ?Resp: normal effort, no distress ?Heart: regular rate ?Abd: soft, non-tender ?Pelvic: patient self-swabbed ? ?ROS, labs, PMH reviewed ? ?US OB Comp AddL Gest Less 14 Wks ? ?Result Date: 10/24/2021 ?CLINICAL DATA:  Vaginal bleeding. EXAM: TWIN OBSTETRIC <14WK [place] AND TRANSVAGINAL OB US TECHNIQUE: Both transabdominal and transvaginal ultrasound examinations were performed  for complete evaluation of the gestation as well as the maternal uterus, adnexal regions, and pelvic cul-de-sac. Transvaginal technique was performed to assess early pregnancy. COMPARISON:  None. FINDINGS: Number of IUPs:  2 Chorionicity/Amnionicity:  Monochorionic-diamniotic (thin membrane) TWIN 1 Yolk sac:  Visualized. Embryo:  Visualized. Cardiac Activity: Not Visualized. CRL:  13.4 mm   7 w 4 d  US EDC: June 08, 2022. TWIN 2 Yolk sac:  Visualized. Embryo:  Visualized. Cardiac Activity: Not Visualized. CRL:  8.9 mm   6 w 5 d                  US EDC: June 14, 2022. Subchorionic hemorrhage:  None visualized. Maternal uterus/adnexae: Probable small corpus luteum cyst seen in right ovary. Left ovary is unremarkable. No free fluid is noted. IMPRESSION: Probable early twin intrauterine gestational sacs with yolk sacs and fetal poles, but no cardiac activity yet visualized. Recommend follow-up quantitative B-HCG levels and follow-up US in 14 days to assess viability. This recommendation follows SRU consensus guidelines: Diagnostic Criteria for Nonviable Pregnancy Early in the First Trimester. Malva Limes Engl J Med 2013; 409:8119-14; 369:1443-51. Electronically Signed   By: Lupita RaiderJames  Green Jr M.D.   On: 10/24/2021 11:25  ? ?US OB LESS THAN 14 WEEKS WITH OB TRANSVAGINAL ? ?Result Date: 10/24/2021 ?CLINICAL DATA:  Vaginal bleeding. EXAM: TWIN OBSTETRIC <14WK US AND TRANSVAGINAL OB US TECHNIQUE: Both transabdominal and transvaginal ultrasound examinations were performed for complete evaluation of the gestation as well as the maternal uterus, adnexal regions, and pelvic cul-de-sac. Transvaginal technique was performed to assess early pregnancy. COMPARISON:  None. FINDINGS: Number of IUPs:  2 Chorionicity/Amnionicity:  Monochorionic-diamniotic (thin membrane) TWIN 1 Yolk sac:  Visualized. Embryo:  Visualized. Cardiac Activity: Not Visualized. CRL:  13.4 mm   7 w 4 d                  [facility] TWIN 2 Yolk sac:   Visualized. Embryo:  Visualized. Cardiac Activity: Not Visualized. CRL:  8.9 mm   6 w 5 d                  US EDC: June 14, 2022. Subchorionic hemorrhage:  None visualized. Maternal uterus/adnexae: Probable small

## 2021-10-25 LAB — GC/CHLAMYDIA PROBE AMP (~~LOC~~) NOT AT ARMC
Chlamydia: NEGATIVE
Comment: NEGATIVE
Comment: NORMAL
Neisseria Gonorrhea: NEGATIVE

## 2021-10-29 ENCOUNTER — Encounter (HOSPITAL_BASED_OUTPATIENT_CLINIC_OR_DEPARTMENT_OTHER): Payer: Self-pay | Admitting: *Deleted

## 2021-10-31 ENCOUNTER — Ambulatory Visit (INDEPENDENT_AMBULATORY_CARE_PROVIDER_SITE_OTHER): Payer: BC Managed Care – PPO

## 2021-10-31 ENCOUNTER — Ambulatory Visit (INDEPENDENT_AMBULATORY_CARE_PROVIDER_SITE_OTHER): Payer: BC Managed Care – PPO | Admitting: Obstetrics & Gynecology

## 2021-10-31 ENCOUNTER — Encounter (HOSPITAL_BASED_OUTPATIENT_CLINIC_OR_DEPARTMENT_OTHER): Payer: Self-pay

## 2021-10-31 ENCOUNTER — Encounter (HOSPITAL_BASED_OUTPATIENT_CLINIC_OR_DEPARTMENT_OTHER): Payer: BC Managed Care – PPO

## 2021-10-31 DIAGNOSIS — O30031 Twin pregnancy, monochorionic/diamniotic, first trimester: Secondary | ICD-10-CM | POA: Diagnosis not present

## 2021-10-31 DIAGNOSIS — Z3A01 Less than 8 weeks gestation of pregnancy: Secondary | ICD-10-CM | POA: Diagnosis not present

## 2021-10-31 DIAGNOSIS — O3680X1 Pregnancy with inconclusive fetal viability, fetus 1: Secondary | ICD-10-CM

## 2021-10-31 DIAGNOSIS — O3680X9 Pregnancy with inconclusive fetal viability, other fetus: Secondary | ICD-10-CM | POA: Diagnosis not present

## 2021-10-31 DIAGNOSIS — O3680X0 Pregnancy with inconclusive fetal viability, not applicable or unspecified: Secondary | ICD-10-CM

## 2021-11-03 NOTE — Progress Notes (Signed)
GYNECOLOGY  VISIT ? ?CC:   assess viability of pregnancy ? ?HPI: ?31 y.o. G1P0 Single Black or [place] American female here for follow up from MAU.  Pt was seen on 10/24/2021.  Twin pregnancy was seen with fetus noted at 13.47mm at 7 4/7 weeks and second fetus measured 8.54mm at 6 5/7 weeks.  No FCA noted with either embryo.  Denies vaginal bleeding.  Is not having any cramping.   ? ?Ultrasound today done at bedside and results discussed.  Again twin pregnancy is noted.  Neither embryo has FCA.  Embryos measuring 6 6/7 and 6 3/7 gestational age today.  Small amount of fluid in cervix today.   ? ?Pt and I discussed ultrasound does not show progression of pregnancy at this point and still not FCA noted. ? ?By LMP, pt should be close to [redacted] weeks gestation. ? ? ? ?Patient Active Problem List  ? Diagnosis Date Noted  ? Essential hypertension 08/14/2020  ? History of deep vein thrombosis (DVT) of lower extremity 09/23/2019  ? 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 Personal history of PE (pulmonary embolism) 12/21/2018  ? Asthma 10/01/2018  ? Polycystic ovarian syndrome 10/01/2018  ? Cervical dysplasia, mild 09/20/2014  ? Irregular periods/menstrual cycles 09/07/2014  ? ? ?Past Medical History:  ?Diagnosis Date  ? DVT (deep venous thrombosis) (HCC) 2020  ? Hypertension   ? Insulin resistance   ? PE (pulmonary thromboembolism) (HCC) 2020  ? ? ?No past surgical history on file. ? ?MEDS:   ?Current Outpatient Medications on File Prior to Visit  ?Medication Sig Dispense Refill  ? amLODipine (NORVASC) 2.5 MG tablet Take 2.5 mg by mouth daily.    ? enoxaparin (LOVENOX) 120 MG/0.8ML injection Inject 0.8 mLs (120 mg total) into the skin daily. 30 mL 2  ? levocetirizine (XYZAL) 5 MG tablet Take 1 tablet by mouth as needed.    ? metFORMIN (GLUCOPHAGE-XR) 500 MG 24 hr tablet Take 500 mg by mouth daily.    ? Prenatal Vit-Fe Fumarate-FA (PRENATAL MULTIVITAMIN) TABS tablet Take 1 tablet by mouth daily at 12 noon.    ? valACYclovir (VALTREX) 1000  MG tablet SMARTSIG:1 Tablet(s) By Mouth Every 12 Hours    ? ?No current facility-administered medications on file prior to visit.  ? ? ?ALLERGIES: Patient has no known allergies. ? ?Family History  ?Adopted: Yes  ?Problem Relation Age of Onset  ? Hypertension Mother   ? Diabetes Maternal Aunt   ? ? ?SH:  non smoker, partnered ? ?Review of Systems  ?All other systems reviewed and are negative. ? ?PHYSICAL EXAMINATION:   ?  ?General appearance: alert, cooperative and appears stated age ? ?Pelvic: External genitalia:  no lesions ?             Urethra:  normal appearing urethra with no masses, tenderness or lesions ?             Bartholins and Skenes: normal    ?             Vagina: normal appearing vagina with normal color and discharge, no lesions ?              ?Chaperone, Raechel Ache, RN, CMA, was present for exam. ? ?Assessment/Plan: ?1. Monochorionic diamniotic twin gestation in first trimester ?- US OB Comp Less 14 Wks ? ?2. Encounter to determine fetal viability of pregnancy, single or unspecified fetus ?- pt aware that I am concerned for failed pregnancy.  Treatment options discussed including repeating ultrasound  in 1 week to be sure about pregnancy before proceeding with any procedures.  We did discuss outpatient management of failed pregnancy vs operative procedure.  Pt is going to consider options.   ? ?Total time with pt and documentation:  23 minutes ? ? ?

## 2021-11-06 ENCOUNTER — Encounter (HOSPITAL_BASED_OUTPATIENT_CLINIC_OR_DEPARTMENT_OTHER): Payer: Self-pay | Admitting: Obstetrics & Gynecology

## 2021-11-06 ENCOUNTER — Ambulatory Visit (INDEPENDENT_AMBULATORY_CARE_PROVIDER_SITE_OTHER): Payer: BC Managed Care – PPO | Admitting: Obstetrics & Gynecology

## 2021-11-06 ENCOUNTER — Ambulatory Visit (INDEPENDENT_AMBULATORY_CARE_PROVIDER_SITE_OTHER): Payer: BC Managed Care – PPO

## 2021-11-06 VITALS — BP 128/63 | HR 79 | Wt 282.4 lb

## 2021-11-06 DIAGNOSIS — O3680X1 Pregnancy with inconclusive fetal viability, fetus 1: Secondary | ICD-10-CM

## 2021-11-06 DIAGNOSIS — O021 Missed abortion: Secondary | ICD-10-CM | POA: Diagnosis not present

## 2021-11-06 DIAGNOSIS — O30031 Twin pregnancy, monochorionic/diamniotic, first trimester: Secondary | ICD-10-CM

## 2021-11-06 DIAGNOSIS — O3680X9 Pregnancy with inconclusive fetal viability, other fetus: Secondary | ICD-10-CM | POA: Diagnosis not present

## 2021-11-06 DIAGNOSIS — Z86711 Personal history of pulmonary embolism: Secondary | ICD-10-CM | POA: Diagnosis not present

## 2021-11-06 DIAGNOSIS — Z3A01 Less than 8 weeks gestation of pregnancy: Secondary | ICD-10-CM | POA: Diagnosis not present

## 2021-11-06 DIAGNOSIS — O3680X0 Pregnancy with inconclusive fetal viability, not applicable or unspecified: Secondary | ICD-10-CM

## 2021-11-09 ENCOUNTER — Telehealth: Payer: Self-pay

## 2021-11-09 NOTE — Progress Notes (Signed)
GYNECOLOGY  VISIT ? ?CC:   follow up  ? ?HPI: ?30 y.o. G1P0 Single Black or [place] American female here for recheck of fetal viability and to discuss treatment options.  Pt has not had any bleeding or cramping.  Significant other is with her today.  Bedside ultrasound performed today showing CRL of 0.98cm and also 0.72cm.  No FCA noted in either embryo.  At this point, pt should be 11 weeks.  First ultrasound performed almost two weeks ago with minimal change in embryo size and no developing cardiac activity.  Feel she has a missed ab at this point.  Feel comfortable offering treatment options as well in addition to expectant management.  However, this is still an option as well.  D&E and outpatient treatment with mifepristone/misoprostol discussed with pt.  Increased risk of failure with outpatient treatment with missed ab.  Risks with outpatient treatment discussed with pt today including failure and need for operative procedure, incomplete treatment, significant bleeding, anemia.  D&E discussed as well but she is leaning towards medical treatment.  Will need to call pharmacies to see who carried medications.  Pt will consider and if changes mind, will let me know.  Genetic studies also discussed.  She thinks she would like this as well. ? ?Will plan to continue lovenox for two weeks post end of pregnancy and then transition back to Eliquis.  Also, will place referral to hematology/oncology to transition pt's care locally.  All questions answered. ? ?Patient Active Problem List  ? Diagnosis Date Noted  ? Essential hypertension 08/14/2020  ? History of deep vein thrombosis (DVT) of lower extremity 09/23/2019  ? 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 Personal history of PE (pulmonary embolism) 12/21/2018  ? Asthma 10/01/2018  ? Polycystic ovarian syndrome 10/01/2018  ? Cervical dysplasia, mild 09/20/2014  ? Irregular periods/menstrual cycles 09/07/2014  ? ? ?Past Medical History:  ?Diagnosis Date  ? DVT (deep venous  thrombosis) (HCC) 2020  ? Hypertension   ? Insulin resistance   ? PE (pulmonary thromboembolism) (HCC) 2020  ? ? ?No past surgical history on file. ? ?MEDS:   ?Current Outpatient Medications on File Prior to Visit  ?Medication Sig Dispense Refill  ? amLODipine (NORVASC) 2.5 MG tablet Take 2.5 mg by mouth daily.    ? enoxaparin (LOVENOX) 120 MG/0.8ML injection Inject 0.8 mLs (120 mg total) into the skin daily. 30 mL 2  ? levocetirizine (XYZAL) 5 MG tablet Take 1 tablet by mouth as needed.    ? metFORMIN (GLUCOPHAGE-XR) 500 MG 24 hr tablet Take 500 mg by mouth daily.    ? Prenatal Vit-Fe Fumarate-FA (PRENATAL MULTIVITAMIN) TABS tablet Take 1 tablet by mouth daily at 12 noon.    ? valACYclovir (VALTREX) 1000 MG tablet SMARTSIG:1 Tablet(s) By Mouth Every 12 Hours    ? ?No current facility-administered medications on file prior to visit.  ? ? ?ALLERGIES: Patient has no known allergies. ? ?Family History  ?Adopted: Yes  ?Problem Relation Age of Onset  ? Hypertension Mother   ? Diabetes Maternal Aunt   ? ? ?SH:  single, non smoker ? ?Review of Systems  ?All other systems reviewed and are negative. ? ?PHYSICAL EXAMINATION:   ? ?BP 128/63 (BP Location: Left Arm, Patient Position: Sitting, Cuff Size: Large)   Pulse 79   Wt 282 lb 6.4 oz (128.1 kg)   BMI 40.52 kg/m?     ?General appearance: alert, cooperative and appears stated age ? ?Pelvic: External genitalia:  no lesions ?  Urethra:  normal appearing urethra with no masses, tenderness or lesions ?             Bartholins and Skenes: normal       ? ?Chaperone, Raechel Ache, RN, was present for exam. ? ?Assessment/Plan: ?1. Monochorionic diamniotic twin gestation in first trimester ?- US OB Comp Less 14 Wks; Future ? ?2. Encounter to determine fetal viability of pregnancy, single or unspecified fetus ? ?3. History of pulmonary embolism ?- Ambulatory referral to Hematology / Oncology ? ?4. Missed abortion ?- pt leaning towards medical treatment at this point.  Will  need to contact pharmacies and then get back with pt. ? ?Total time with pt excluding ultrasound:  35 minutes ? ?

## 2021-11-09 NOTE — Telephone Encounter (Signed)
Called patient, no answer, left voicemail with surgery date, time and preop instructions. ?

## 2021-11-12 ENCOUNTER — Telehealth (HOSPITAL_BASED_OUTPATIENT_CLINIC_OR_DEPARTMENT_OTHER): Payer: Self-pay | Admitting: Obstetrics & Gynecology

## 2021-11-12 NOTE — Telephone Encounter (Signed)
Called patient and left a message to please call the office back to scheduled a follow up appointment. ?

## 2021-11-12 NOTE — Telephone Encounter (Signed)
Patient called today stated she is cancelling her surgery for this Wednesday and that she needs more time . ?

## 2021-11-14 ENCOUNTER — Encounter (HOSPITAL_BASED_OUTPATIENT_CLINIC_OR_DEPARTMENT_OTHER): Admission: RE | Payer: Self-pay | Source: Home / Self Care

## 2021-11-14 ENCOUNTER — Ambulatory Visit (HOSPITAL_BASED_OUTPATIENT_CLINIC_OR_DEPARTMENT_OTHER)
Admission: RE | Admit: 2021-11-14 | Payer: BC Managed Care – PPO | Source: Home / Self Care | Admitting: Obstetrics and Gynecology

## 2021-11-14 SURGERY — DILATION AND EVACUATION, UTERUS
Anesthesia: Choice

## 2021-11-22 ENCOUNTER — Telehealth (HOSPITAL_BASED_OUTPATIENT_CLINIC_OR_DEPARTMENT_OTHER): Payer: Self-pay | Admitting: *Deleted

## 2021-11-22 ENCOUNTER — Other Ambulatory Visit (HOSPITAL_BASED_OUTPATIENT_CLINIC_OR_DEPARTMENT_OTHER): Payer: BC Managed Care – PPO

## 2021-11-22 ENCOUNTER — Telehealth (HOSPITAL_BASED_OUTPATIENT_CLINIC_OR_DEPARTMENT_OTHER): Payer: Self-pay | Admitting: Obstetrics & Gynecology

## 2021-11-22 NOTE — Telephone Encounter (Signed)
Called pt to follow up on her bleeding s/p SAB. Pt states that she had some cramping on Tuesday and bleeding Tuesday evening. She says the bleeding has not been too bad, not heavier than a period. Advised that she should have an Hcg level drawn in about a week to make sure that her levels are declining. Pt agreeable. Appt given.  ?

## 2021-11-22 NOTE — Telephone Encounter (Signed)
Late entry from phone call around 5:30pm on 11/20/2021.  Pt has started bleeding and it has been heavy at times but manageable.  Expectations for miscarriage given.  Will need to follow down HCG levels.  Pt given bleeding precautions.  She does not want genetic testing done.  She also asked about contraception.  Micronor, nexplanon, depo provera and progesterone or copper IUD all discussed.  Pt will do some research and decide after miscarriage is complete. ?

## 2021-11-23 ENCOUNTER — Telehealth (HOSPITAL_BASED_OUTPATIENT_CLINIC_OR_DEPARTMENT_OTHER): Payer: Self-pay | Admitting: *Deleted

## 2021-11-23 DIAGNOSIS — O021 Missed abortion: Secondary | ICD-10-CM

## 2021-11-23 MED ORDER — ONDANSETRON HCL 4 MG PO TABS: 4.0000 mg | ORAL_TABLET | Freq: Three times a day (TID) | ORAL | 0 refills | Status: DC | PRN

## 2021-11-23 NOTE — Telephone Encounter (Signed)
Pt called and stated that she had some intense cramping and increased bleeding last night. It has improved this morning. Advised pt that increased bleeding and cramping can be expected with a miscarriage. Advised she could take tylenol to help with pain from cramps. Advised that she should go to MAU if she starts to soak a pad every 1-2 hours or if pain becomes too strong. Pt c/o some nausea with the pain. Rx sent to pharmacy for zofran.  ?

## 2021-11-29 ENCOUNTER — Other Ambulatory Visit (HOSPITAL_BASED_OUTPATIENT_CLINIC_OR_DEPARTMENT_OTHER): Payer: BC Managed Care – PPO

## 2021-11-29 ENCOUNTER — Other Ambulatory Visit (HOSPITAL_BASED_OUTPATIENT_CLINIC_OR_DEPARTMENT_OTHER): Payer: Self-pay | Admitting: Obstetrics & Gynecology

## 2021-11-29 DIAGNOSIS — O039 Complete or unspecified spontaneous abortion without complication: Secondary | ICD-10-CM

## 2021-11-30 LAB — BETA HCG QUANT (REF LAB): hCG Quant: 188 m[IU]/mL

## 2021-12-01 ENCOUNTER — Encounter (HOSPITAL_COMMUNITY): Payer: Self-pay | Admitting: Obstetrics & Gynecology

## 2021-12-01 ENCOUNTER — Inpatient Hospital Stay (HOSPITAL_COMMUNITY)
Admission: AD | Admit: 2021-12-01 | Discharge: 2021-12-01 | Disposition: A | Discharge status: Home / Self Care | Payer: BC Managed Care – PPO | Attending: Obstetrics & Gynecology | Admitting: Obstetrics & Gynecology

## 2021-12-01 ENCOUNTER — Inpatient Hospital Stay (HOSPITAL_COMMUNITY): Payer: BC Managed Care – PPO

## 2021-12-01 ENCOUNTER — Other Ambulatory Visit: Payer: Self-pay

## 2021-12-01 DIAGNOSIS — Z86711 Personal history of pulmonary embolism: Secondary | ICD-10-CM | POA: Diagnosis not present

## 2021-12-01 DIAGNOSIS — I10 Essential (primary) hypertension: Secondary | ICD-10-CM | POA: Insufficient documentation

## 2021-12-01 DIAGNOSIS — Z3A01 Less than 8 weeks gestation of pregnancy: Secondary | ICD-10-CM | POA: Diagnosis not present

## 2021-12-01 DIAGNOSIS — O021 Missed abortion: Secondary | ICD-10-CM | POA: Diagnosis not present

## 2021-12-01 DIAGNOSIS — O30001 Twin pregnancy, unspecified number of placenta and unspecified number of amniotic sacs, first trimester: Secondary | ICD-10-CM

## 2021-12-01 DIAGNOSIS — Z7901 Long term (current) use of anticoagulants: Secondary | ICD-10-CM | POA: Insufficient documentation

## 2021-12-01 DIAGNOSIS — O036 Delayed or excessive hemorrhage following complete or unspecified spontaneous abortion: Secondary | ICD-10-CM | POA: Insufficient documentation

## 2021-12-01 DIAGNOSIS — Z86718 Personal history of other venous thrombosis and embolism: Secondary | ICD-10-CM | POA: Insufficient documentation

## 2021-12-01 HISTORY — DX: Polycystic ovarian syndrome: E28.2

## 2021-12-01 LAB — CBC
HCT: 38.7 % (ref 36.0–46.0)
Hemoglobin: 12.5 g/dL (ref 12.0–15.0)
MCH: 26.3 pg (ref 26.0–34.0)
MCHC: 32.3 g/dL (ref 30.0–36.0)
MCV: 81.3 fL (ref 80.0–100.0)
Platelets: 377 10*3/uL (ref 150–400)
RBC: 4.76 MIL/uL (ref 3.87–5.11)
RDW: 14.4 % (ref 11.5–15.5)
WBC: 8 10*3/uL (ref 4.0–10.5)
nRBC: 0 % (ref 0.0–0.2)

## 2021-12-01 LAB — HCG, QUANTITATIVE, PREGNANCY: hCG, Beta Chain, Quant, S: 179 m[IU]/mL — ABNORMAL HIGH (ref <5)

## 2021-12-01 MED ORDER — PROMETHAZINE HCL 25 MG PO TABS
25.0000 mg | ORAL_TABLET | Freq: Four times a day (QID) | ORAL | 0 refills | Status: DC | PRN
Start: 2021-12-01 — End: 2021-12-04

## 2021-12-01 MED ORDER — OXYCODONE-ACETAMINOPHEN 5-325 MG PO TABS: 1.0000 | ORAL_TABLET | Freq: Four times a day (QID) | ORAL | 0 refills | Status: DC | PRN

## 2021-12-01 MED ORDER — IBUPROFEN 800 MG PO TABS
800.0000 mg | ORAL_TABLET | Freq: Three times a day (TID) | ORAL | 0 refills | Status: DC
Start: 2021-12-01 — End: 2021-12-05

## 2021-12-01 MED ORDER — MISOPROSTOL 200 MCG PO TABS: 800.0000 ug | ORAL_TABLET | Freq: Four times a day (QID) | ORAL | 0 refills | Status: DC

## 2021-12-01 NOTE — MAU Note (Signed)
Marissa Hansen is a 31 y.o. at [redacted]w[redacted]d here in MAU reporting: VB that became heavier last night.  Reports began bleeding on April 9th but last night VB increased after it had only been VB with wiping..  Reports occasionally passing small clots.  Denies saturating thorough sanitary napkin.  Endorses mild abdominal cramping.  Onset of complaint: last night Pain score: 3/10 Vitals:   12/01/21 0928  BP: 133/71  Pulse: 85  Resp: 18  Temp: 98 F (36.7 C)  SpO2: 100%     FHT:N/A Lab orders placed from triage:

## 2021-12-01 NOTE — MAU Provider Note (Signed)
History     CSN: [id number]  Arrival date and time: 12/01/21 8921   Event Date/Time   First Provider Initiated Contact with Patient 12/01/21 [phone]      Chief Complaint  Patient presents with   Vaginal Bleeding   HPI Marissa Hansen is a 31 y.o. G1P0 at [redacted]w[redacted]d who presents with vaginal bleeding. She was diagnosed with a missed AB of twins on 4/19. She chose expectant management and reports she had an episode of bleeding from 5/11 until now. She reports severe cramping and heavy bleeding with clots. She reports it stopped and restarted 2 days ago. She denies any bleeding or pain at this time but wants to be sure everything is ok.   OB History     Gravida  1   Para      Term      Preterm      AB      Living         SAB      IAB      Ectopic      Multiple      Live Births              Past Medical History:  Diagnosis Date   DVT (deep venous thrombosis) (HCC) 2020   Hypertension    Insulin resistance    PCOS (polycystic ovarian syndrome)    PE (pulmonary thromboembolism) (HCC) 2020    Past Surgical History:  Procedure Laterality Date   NO PAST SURGERIES      Family History  Adopted: Yes  Problem Relation Age of Onset   Hypertension Mother    Atrial fibrillation Father    Diabetes Maternal Aunt     Social History   Tobacco Use   Smoking status: Never   Smokeless tobacco: Never  Vaping Use   Vaping Use: Never used  Substance Use Topics   Alcohol use: Not Currently    Comment: occ   Drug use: No    Allergies: No Known Allergies  Medications Prior to Admission  Medication Sig Dispense Refill Last Dose   amLODipine (NORVASC) 2.5 MG tablet Take 2.5 mg by mouth daily.   12/01/2021   enoxaparin (LOVENOX) 120 MG/0.8ML injection Inject 0.8 mLs (120 mg total) into the skin daily. 30 mL 2 11/30/2021   levocetirizine (XYZAL) 5 MG tablet Take 1 tablet by mouth as needed.   Past Month   metFORMIN (GLUCOPHAGE-XR) 500 MG 24 hr tablet Take 500 mg by  mouth daily.      ondansetron (ZOFRAN) 4 MG tablet Take 1 tablet (4 mg total) by mouth every 8 (eight) hours as needed for nausea or vomiting. 20 tablet 0    Prenatal Vit-Fe Fumarate-FA (PRENATAL MULTIVITAMIN) TABS tablet Take 1 tablet by mouth daily at 12 noon.      valACYclovir (VALTREX) 1000 MG tablet SMARTSIG:1 Tablet(s) By Mouth Every 12 Hours   More than a month    Review of Systems  Constitutional: Negative.  Negative for fatigue and fever.  HENT: Negative.    Respiratory: Negative.  Negative for shortness of breath.   Cardiovascular: Negative.  Negative for chest pain.  Gastrointestinal:  Positive for abdominal pain. Negative for constipation, diarrhea, nausea and vomiting.  Genitourinary:  Positive for vaginal bleeding. Negative for dysuria and vaginal discharge.  Neurological: Negative.  Negative for dizziness and headaches.  Physical Exam   Blood pressure 133/71, pulse 77, temperature 98 F (36.7 C), temperature source Oral, resp. rate 18, height  5\' 11"  (1.803 m), weight 129.4 kg, SpO2 100 %.  Physical Exam Vitals and nursing note reviewed.  Constitutional:      General: She is not in acute distress.    Appearance: She is well-developed.  HENT:     Head: Normocephalic.  Eyes:     Pupils: Pupils are equal, round, and reactive to light.  Cardiovascular:     Rate and Rhythm: Normal rate and regular rhythm.     Heart sounds: Normal heart sounds.  Pulmonary:     Effort: Pulmonary effort is normal. No respiratory distress.     Breath sounds: Normal breath sounds.  Abdominal:     General: Bowel sounds are normal. There is no distension.     Palpations: Abdomen is soft.     Tenderness: There is no abdominal tenderness.  Skin:    General: Skin is warm and dry.  Neurological:     Mental Status: She is alert and oriented to person, place, and time.  Psychiatric:        Mood and Affect: Mood normal.        Behavior: Behavior normal.        Thought Content: Thought content  normal.        Judgment: Judgment normal.    MAU Course  Procedures Results for orders placed or performed during the hospital encounter of 12/01/21 (from the past 24 hour(s))  CBC     Status: None   Collection Time: 12/01/21  9:47 AM  Result Value Ref Range   WBC 8.0 4.0 - 10.5 K/uL   RBC 4.76 3.87 - 5.11 MIL/uL   Hemoglobin 12.5 12.0 - 15.0 g/dL   HCT 12/03/21 40.9 - 81.1 %   MCV 81.3 80.0 - 100.0 fL   MCH 26.3 26.0 - 34.0 pg   MCHC 32.3 30.0 - 36.0 g/dL   RDW 91.4 78.2 - 95.6 %   Platelets 377 150 - 400 K/uL   nRBC 0.0 0.0 - 0.2 %  hCG, quantitative, pregnancy     Status: Abnormal   Collection Time: 12/01/21  9:47 AM  Result Value Ref Range   hCG, Beta Chain, Quant, S 179 (H) <5 mIU/mL    12/03/21 OB Transvaginal  Result Date: 12/01/2021 CLINICAL DATA:  Pregnant patient.  Bleeding.  Known missed abortion. EXAM: TWIN OBSTETRICAL ULTRASOUND <14 WKS TECHNIQUE: Transabdominal ultrasound was performed for evaluation of the gestation as well as the maternal uterus and adnexal regions. COMPARISON:  None Available. FINDINGS: Number of IUPs:  2 Chorionicity/Amnionicity:  Mono chorionic, diamniotic TWIN 1 Yolk sac:  No Embryo:  Yes Cardiac Activity: No MSD:   mm    w     d CRL:   11.1 mm   7 w 1 d                  12/03/2021 EDC: July 19, 2022 TWIN 2 Yolk sac:  No Embryo:  Yes Cardiac Activity: No MSD:   mm    w     d CRL:   9.8 mm   7 w 0 d                  July 21, 2022 EDC: July 20, 2022 Subchorionic hemorrhage:  None visualized. Maternal uterus/adnexae: The ovaries are unremarkable. Other: 2 gestational sacs are identified, extending from the fundus into the lower uterine segment and cervix. IMPRESSION: The findings are consistent with the history of missed spontaneous abortion of twin pregnancy. No fetal heart tones  identified in either embryo. The 2 gestational sacs extend from the fundus into the lower uterine segment/cervix. Electronically Signed   By: Gerome Samavid  Williams III M.D.   On: 12/01/2021 10:50      MDM CBC, HCG B Pos US OB Transvaginal  Lengthy discussion with patient about results and CNM recommendation for intervention given lengthy time since diagnosis without completion of miscarriage. Discussed cytotec vs. D&E. Patient desires cytotec. Lengthy discussion with patient and support person regarding expectation for bleeding and pain management. Warning signs for when to return to MAU reviewed at length.  Message sent to [facility] to scheduled visit with MD this week.   Assessment and Plan   1. Missed abortion with fetal demise before 20 completed weeks of gestation   2. Twin gestation in first trimester, unspecified multiple gestation type   3. [redacted] weeks gestation of pregnancy    -Discharge home in stable condition -Rx for cytotec, ibuprofen, percocet and phenergan sent to patient's pharmacy -Vaginal bleeding and pain precautions discussed -Patient advised to follow-up with [facility] this week for follow up -Patient may return to MAU as needed or if her condition were to change or worsen   Rolm BookbinderCaroline M Serena Petterson CNM 12/01/2021, 9:49 AM

## 2021-12-03 ENCOUNTER — Encounter (HOSPITAL_BASED_OUTPATIENT_CLINIC_OR_DEPARTMENT_OTHER): Payer: Self-pay | Admitting: *Deleted

## 2021-12-03 ENCOUNTER — Telehealth (HOSPITAL_BASED_OUTPATIENT_CLINIC_OR_DEPARTMENT_OTHER): Payer: Self-pay | Admitting: *Deleted

## 2021-12-03 NOTE — Telephone Encounter (Signed)
Called pt in attempt to follow up on recent ED visit. Unable to leave a message. Will send mychart message as well.

## 2021-12-04 ENCOUNTER — Other Ambulatory Visit: Payer: Self-pay

## 2021-12-04 ENCOUNTER — Encounter (HOSPITAL_BASED_OUTPATIENT_CLINIC_OR_DEPARTMENT_OTHER): Payer: Self-pay | Admitting: Obstetrics & Gynecology

## 2021-12-04 ENCOUNTER — Other Ambulatory Visit (HOSPITAL_BASED_OUTPATIENT_CLINIC_OR_DEPARTMENT_OTHER): Payer: Self-pay | Admitting: Obstetrics & Gynecology

## 2021-12-04 ENCOUNTER — Encounter (HOSPITAL_BASED_OUTPATIENT_CLINIC_OR_DEPARTMENT_OTHER): Payer: Self-pay | Admitting: *Deleted

## 2021-12-04 DIAGNOSIS — Z01818 Encounter for other preprocedural examination: Secondary | ICD-10-CM

## 2021-12-04 NOTE — Anesthesia Preprocedure Evaluation (Signed)
Anesthesia Evaluation  Patient identified by MRN, date of birth, ID band Patient awake    Reviewed: Allergy & Precautions, NPO status , Patient's Chart, lab work & pertinent test results  History of Anesthesia Complications Negative for: history of anesthetic complications  Airway Mallampati: II  TM Distance: >3 FB Neck ROM: Full    Dental  (+) Missing,    Pulmonary asthma , PE (2020)   Pulmonary exam normal        Cardiovascular hypertension, Pt. on medications Normal cardiovascular exam     Neuro/Psych negative neurological ROS  negative psych ROS   GI/Hepatic negative GI ROS, Neg liver ROS,   Endo/Other  Morbid obesity  Renal/GU negative Renal ROS  negative genitourinary   Musculoskeletal negative musculoskeletal ROS (+)   Abdominal   Peds  Hematology negative hematology ROS (+)   Anesthesia Other Findings Day of surgery medications reviewed with patient.  Reproductive/Obstetrics negative OB ROS                            Anesthesia Physical Anesthesia Plan  ASA: 3  Anesthesia Plan: General   Post-op Pain Management: Tylenol PO (pre-op)* and Toradol IV (intra-op)*   Induction: Intravenous  PONV Risk Score and Plan: 4 or greater and Treatment may vary due to age or medical condition, Midazolam, Dexamethasone, Ondansetron and Scopolamine patch - Pre-op  Airway Management Planned: LMA  Additional Equipment: None  Intra-op Plan:   Post-operative Plan: Extubation in OR  Informed Consent: I have reviewed the patients History and Physical, chart, labs and discussed the procedure including the risks, benefits and alternatives for the proposed anesthesia with the patient or authorized representative who has indicated his/her understanding and acceptance.     Dental advisory given  Plan Discussed with: CRNA  Anesthesia Plan Comments:        Anesthesia Quick  Evaluation

## 2021-12-04 NOTE — Progress Notes (Signed)
Spoke w/ via phone for pre-op interview---Marissa Hansen needs dos---- CBC, type & screen per surgeon, ISTAT & EKG per anesthesia               Hansen results------12/24/20 EKG in chart & Epic = ventricular trigeminy, please ask MDA if they want repeated day of surgery, order for EKG placed, 01/25/21 Echo in Epic, EF 61%, 12/01/21 CBC in Epic COVID test -----patient states asymptomatic no test needed Arrive at -------0700 on Wednesday, 12/04/21 NPO after MN NO Solid Food.  Clear liquids from MN until---0600 Med rec completed Medications to take morning of surgery -----Amlodipine ( Per Dr. Hyacinth Meeker, patient should not take Lovenox the morning of surgery.) See encounter dated 12/04/21 in Epic & chart. Diabetic medication -----Hold Wegovy morning of surgery. Patient instructed no nail polish to be worn day of surgery Patient instructed to bring photo id and insurance card day of surgery Patient aware to have Driver (ride ) / caregiver    for 24 hours after surgery - fiance, Marcell Patient Special Instructions -----none Pre-Op special Istructions -----none Patient verbalized understanding of instructions that were given at this phone interview. Patient denies shortness of breath, chest pain, fever, cough at this phone interview.

## 2021-12-05 ENCOUNTER — Other Ambulatory Visit (HOSPITAL_COMMUNITY): Payer: Self-pay

## 2021-12-05 ENCOUNTER — Ambulatory Visit (HOSPITAL_BASED_OUTPATIENT_CLINIC_OR_DEPARTMENT_OTHER): Payer: BC Managed Care – PPO | Admitting: Certified Registered Nurse Anesthetist

## 2021-12-05 ENCOUNTER — Other Ambulatory Visit: Payer: Self-pay

## 2021-12-05 ENCOUNTER — Encounter (HOSPITAL_BASED_OUTPATIENT_CLINIC_OR_DEPARTMENT_OTHER)
Admission: RE | Disposition: A | Discharge status: Home / Self Care | Payer: Self-pay | Source: Home / Self Care | Attending: Obstetrics & Gynecology

## 2021-12-05 ENCOUNTER — Ambulatory Visit (HOSPITAL_BASED_OUTPATIENT_CLINIC_OR_DEPARTMENT_OTHER)
Admission: RE | Admit: 2021-12-05 | Discharge: 2021-12-05 | Disposition: A | Discharge status: Home / Self Care | Payer: BC Managed Care – PPO | Attending: Obstetrics & Gynecology | Admitting: Obstetrics & Gynecology

## 2021-12-05 ENCOUNTER — Encounter (HOSPITAL_BASED_OUTPATIENT_CLINIC_OR_DEPARTMENT_OTHER): Payer: Self-pay | Admitting: Obstetrics & Gynecology

## 2021-12-05 DIAGNOSIS — I10 Essential (primary) hypertension: Secondary | ICD-10-CM | POA: Diagnosis not present

## 2021-12-05 DIAGNOSIS — Z01818 Encounter for other preprocedural examination: Secondary | ICD-10-CM

## 2021-12-05 DIAGNOSIS — Z86718 Personal history of other venous thrombosis and embolism: Secondary | ICD-10-CM | POA: Diagnosis not present

## 2021-12-05 DIAGNOSIS — O021 Missed abortion: Secondary | ICD-10-CM | POA: Diagnosis present

## 2021-12-05 DIAGNOSIS — J45909 Unspecified asthma, uncomplicated: Secondary | ICD-10-CM | POA: Diagnosis not present

## 2021-12-05 HISTORY — PX: DILATION AND EVACUATION: SHX1459

## 2021-12-05 HISTORY — DX: Unspecified asthma, uncomplicated: J45.909

## 2021-12-05 HISTORY — DX: Cardiac arrhythmia, unspecified: I49.9

## 2021-12-05 LAB — TYPE AND SCREEN
ABO/RH(D): B POS
Antibody Screen: NEGATIVE

## 2021-12-05 LAB — CBC
HCT: 41.1 % (ref 36.0–46.0)
Hemoglobin: 12.9 g/dL (ref 12.0–15.0)
MCH: 25.9 pg — ABNORMAL LOW (ref 26.0–34.0)
MCHC: 31.4 g/dL (ref 30.0–36.0)
MCV: 82.5 fL (ref 80.0–100.0)
Platelets: 380 10*3/uL (ref 150–400)
RBC: 4.98 MIL/uL (ref 3.87–5.11)
RDW: 14.4 % (ref 11.5–15.5)
WBC: 7.2 10*3/uL (ref 4.0–10.5)
nRBC: 0 % (ref 0.0–0.2)

## 2021-12-05 LAB — POCT I-STAT, CHEM 8
BUN: 9 mg/dL (ref 6–20)
Calcium, Ion: 1.18 mmol/L (ref 1.15–1.40)
Chloride: 104 mmol/L (ref 98–111)
Creatinine, Ser: 0.9 mg/dL (ref 0.44–1.00)
Glucose, Bld: 85 mg/dL (ref 70–99)
HCT: 40 % (ref 36.0–46.0)
Hemoglobin: 13.6 g/dL (ref 12.0–15.0)
Potassium: 3.9 mmol/L (ref 3.5–5.1)
Sodium: 140 mmol/L (ref 135–145)
TCO2: 24 mmol/L (ref 22–32)

## 2021-12-05 SURGERY — DILATION AND EVACUATION, UTERUS
Anesthesia: General | Site: Uterus

## 2021-12-05 MED ORDER — ONDANSETRON HCL 4 MG/2ML IJ SOLN
INTRAMUSCULAR | Status: AC
  Filled 2021-12-05: qty 2

## 2021-12-05 MED ORDER — LIDOCAINE HCL (PF) 2 % IJ SOLN
INTRAMUSCULAR | Status: AC
  Filled 2021-12-05: qty 5

## 2021-12-05 MED ORDER — FENTANYL CITRATE (PF) 100 MCG/2ML IJ SOLN: 25.0000 ug | INTRAMUSCULAR | Status: DC | PRN

## 2021-12-05 MED ORDER — FENTANYL CITRATE (PF) 100 MCG/2ML IJ SOLN
INTRAMUSCULAR | Status: AC
  Filled 2021-12-05: qty 2

## 2021-12-05 MED ORDER — SODIUM CHLORIDE 0.9 % IV SOLN
100.0000 mg | Freq: Once | INTRAVENOUS | Status: AC
  Administered 2021-12-05: 100 mg via INTRAVENOUS
  Filled 2021-12-05: qty 100

## 2021-12-05 MED ORDER — PROPOFOL 10 MG/ML IV BOLUS
INTRAVENOUS | Status: AC
  Filled 2021-12-05: qty 20

## 2021-12-05 MED ORDER — LACTATED RINGERS IV SOLN: INTRAVENOUS | Status: DC

## 2021-12-05 MED ORDER — MIDAZOLAM HCL 2 MG/2ML IJ SOLN
INTRAMUSCULAR | Status: AC
  Filled 2021-12-05: qty 2

## 2021-12-05 MED ORDER — MIDAZOLAM HCL 2 MG/2ML IJ SOLN
INTRAMUSCULAR | Status: DC | PRN
  Administered 2021-12-05: 2 mg via INTRAVENOUS

## 2021-12-05 MED ORDER — AMISULPRIDE (ANTIEMETIC) 5 MG/2ML IV SOLN: 10.0000 mg | Freq: Once | INTRAVENOUS | Status: DC | PRN

## 2021-12-05 MED ORDER — 0.9 % SODIUM CHLORIDE (POUR BTL) OPTIME
TOPICAL | Status: DC | PRN
  Administered 2021-12-05: 500 mL

## 2021-12-05 MED ORDER — DEXAMETHASONE SODIUM PHOSPHATE 10 MG/ML IJ SOLN
INTRAMUSCULAR | Status: AC
  Filled 2021-12-05: qty 1

## 2021-12-05 MED ORDER — LIDOCAINE 2% (20 MG/ML) 5 ML SYRINGE
INTRAMUSCULAR | Status: DC | PRN
  Administered 2021-12-05: 60 mg via INTRAVENOUS

## 2021-12-05 MED ORDER — HYDROCODONE-ACETAMINOPHEN 5-325 MG PO TABS
1.0000 | ORAL_TABLET | Freq: Four times a day (QID) | ORAL | 0 refills | Status: DC | PRN
  Filled 2021-12-05: qty 12, 2d supply, fill #0

## 2021-12-05 MED ORDER — SCOPOLAMINE 1 MG/3DAYS TD PT72
1.0000 | MEDICATED_PATCH | Freq: Once | TRANSDERMAL | Status: DC
  Administered 2021-12-05: 1.5 mg via TRANSDERMAL

## 2021-12-05 MED ORDER — PROPOFOL 10 MG/ML IV BOLUS
INTRAVENOUS | Status: DC | PRN
  Administered 2021-12-05: 100 mg via INTRAVENOUS
  Administered 2021-12-05: 200 mg via INTRAVENOUS

## 2021-12-05 MED ORDER — ONDANSETRON HCL 4 MG/2ML IJ SOLN
INTRAMUSCULAR | Status: DC | PRN
  Administered 2021-12-05: 4 mg via INTRAVENOUS

## 2021-12-05 MED ORDER — ACETAMINOPHEN 500 MG PO TABS
1000.0000 mg | ORAL_TABLET | ORAL | Status: AC
  Administered 2021-12-05: 1000 mg via ORAL

## 2021-12-05 MED ORDER — CEFAZOLIN SODIUM-DEXTROSE 2-4 GM/100ML-% IV SOLN
INTRAVENOUS | Status: AC
  Filled 2021-12-05: qty 100

## 2021-12-05 MED ORDER — SCOPOLAMINE 1 MG/3DAYS TD PT72
MEDICATED_PATCH | TRANSDERMAL | Status: AC
  Filled 2021-12-05: qty 1

## 2021-12-05 MED ORDER — IBUPROFEN 800 MG PO TABS
800.0000 mg | ORAL_TABLET | Freq: Three times a day (TID) | ORAL | 0 refills | Status: DC
Start: 2021-12-05 — End: 2023-02-04
  Filled 2021-12-05: qty 30, 10d supply, fill #0

## 2021-12-05 MED ORDER — KETOROLAC TROMETHAMINE 30 MG/ML IJ SOLN
INTRAMUSCULAR | Status: AC
  Filled 2021-12-05: qty 1

## 2021-12-05 MED ORDER — LIDOCAINE-EPINEPHRINE 1 %-1:100000 IJ SOLN
INTRAMUSCULAR | Status: DC | PRN
  Administered 2021-12-05: 10 mL

## 2021-12-05 MED ORDER — KETOROLAC TROMETHAMINE 30 MG/ML IJ SOLN
INTRAMUSCULAR | Status: DC | PRN
  Administered 2021-12-05: 30 mg via INTRAVENOUS

## 2021-12-05 MED ORDER — ACETAMINOPHEN 500 MG PO TABS
ORAL_TABLET | ORAL | Status: AC
  Filled 2021-12-05: qty 2

## 2021-12-05 MED ORDER — CEFAZOLIN SODIUM-DEXTROSE 2-4 GM/100ML-% IV SOLN
2.0000 g | INTRAVENOUS | Status: DC
Start: 2021-12-05 — End: 2021-12-05

## 2021-12-05 MED ORDER — OXYCODONE HCL 5 MG/5ML PO SOLN: 5.0000 mg | Freq: Once | ORAL | Status: DC | PRN

## 2021-12-05 MED ORDER — DEXAMETHASONE SODIUM PHOSPHATE 10 MG/ML IJ SOLN
INTRAMUSCULAR | Status: DC | PRN
Start: 2021-12-05 — End: 2021-12-05
  Administered 2021-12-05: 5 mg via INTRAVENOUS

## 2021-12-05 MED ORDER — POVIDONE-IODINE 10 % EX SWAB
2.0000 | Freq: Once | CUTANEOUS | Status: DC
Start: 2021-12-05 — End: 2021-12-05

## 2021-12-05 MED ORDER — OXYCODONE HCL 5 MG PO TABS: 5.0000 mg | ORAL_TABLET | Freq: Once | ORAL | Status: DC | PRN

## 2021-12-05 MED ORDER — FENTANYL CITRATE (PF) 100 MCG/2ML IJ SOLN
INTRAMUSCULAR | Status: DC | PRN
  Administered 2021-12-05 (×2): 25 ug via INTRAVENOUS
  Administered 2021-12-05: 50 ug via INTRAVENOUS

## 2021-12-05 SURGICAL SUPPLY — 26 items
CATH ROBINSON RED A/P 16FR (CATHETERS) ×2 IMPLANT
DRSG TELFA 3X8 NADH (GAUZE/BANDAGES/DRESSINGS) ×2 IMPLANT
FILTER UTR ASPR ASSEMBLY (MISCELLANEOUS) ×2 IMPLANT
GAUZE 4X4 16PLY ~~LOC~~+RFID DBL (SPONGE) ×2 IMPLANT
GLOVE BIOGEL PI IND STRL 7.0 (GLOVE) ×1 IMPLANT
GLOVE BIOGEL PI INDICATOR 7.0 (GLOVE) ×1
GLOVE ECLIPSE 6.5 STRL STRAW (GLOVE) ×2 IMPLANT
GOWN STRL REUS W/TWL XL LVL3 (GOWN DISPOSABLE) ×2 IMPLANT
HOSE CONNECTING 18IN BERKELEY (TUBING) ×2 IMPLANT
KIT BERKELEY 1ST TRI 3/8 NO TR (MISCELLANEOUS) ×2 IMPLANT
KIT BERKELEY 1ST TRIMESTER 3/8 (MISCELLANEOUS) ×4 IMPLANT
KIT TURNOVER CYSTO (KITS) ×2 IMPLANT
NS IRRIG 500ML POUR BTL (IV SOLUTION) ×2 IMPLANT
PACK VAGINAL MINOR WOMEN LF (CUSTOM PROCEDURE TRAY) ×2 IMPLANT
PAD DRESSING TELFA 3X8 NADH (GAUZE/BANDAGES/DRESSINGS) ×1 IMPLANT
PAD OB MATERNITY 4.3X12.25 (PERSONAL CARE ITEMS) ×2 IMPLANT
PAD PREP 24X48 CUFFED NSTRL (MISCELLANEOUS) ×2 IMPLANT
SCOPETTES 8  STERILE (MISCELLANEOUS)
SCOPETTES 8 STERILE (MISCELLANEOUS) IMPLANT
SET BERKELEY SUCTION TUBING (SUCTIONS) ×2 IMPLANT
TOWEL OR 17X26 10 PK STRL BLUE (TOWEL DISPOSABLE) ×2 IMPLANT
TRAP TISSUE FILTER (MISCELLANEOUS) ×4 IMPLANT
VACURETTE 10 RIGID CVD (CANNULA) IMPLANT
VACURETTE 7MM CVD STRL WRAP (CANNULA) ×1 IMPLANT
VACURETTE 8 RIGID CVD (CANNULA) IMPLANT
VACURETTE 9 RIGID CVD (CANNULA) IMPLANT

## 2021-12-05 NOTE — H&P (Signed)
Marissa Hansen is an 31 y.o. female G1 here for suction D&E due to h/o missed abortion and failed outpatient management.  Pt did have bleeding after misoprostol dosing.  HCG was decreased significantly with lab work last week.  However, had abnormal bleeding over the weekend and was seen in MAU.  Ultrasound confirmed presence of gestational sac.  She was treated again with cytotec but decided not to take it and desires definitive management.  Procedure, risks and benefits have been discussed.  She is here and ready to proceed.  H/o PE.  Has been on pregnancy dosing Lovenox.  Did hold this yesterday and will restart today.  MBT:  B+  Patient's last menstrual period was 08/20/2021.    Past Medical History:  Diagnosis Date   Asthma    has not had symptoms in years per pt on 12/04/21   COVID 09/2020   no antivirals or infusions, mild symptoms   DVT (deep venous thrombosis) (HCC) 2020   Patient had DVT in her popliteal vein after driving 12 hours to her sister's funeral. Patient had a thrombophilia evaluation that was negative.   Dysrhythmia    PVC's   Hypertension    Insulin resistance    Palpitations 10/2018   LOV w/ cardiology, 08/14/20 Dr. Rosemary Holms in Epic. 01/25/21 Echo in Epic, EF 61%.   PCOS (polycystic ovarian syndrome)    PE (pulmonary thromboembolism) (HCC) 2020   bilateral   Plantar fasciitis 06/13/2021   left foot    Past Surgical History:  Procedure Laterality Date   WISDOM TOOTH EXTRACTION     around 2005    Family History  Adopted: Yes  Problem Relation Age of Onset   Hypertension Mother    Atrial fibrillation Father    Diabetes Maternal Aunt     Social History:  reports that she has never smoked. She has never used smokeless tobacco. She reports that she does not currently use alcohol. She reports that she does not use drugs.  Allergies: No Known Allergies  Medications Prior to Admission  Medication Sig Dispense Refill Last Dose   amLODipine (NORVASC) 2.5  MG tablet Take 2.5 mg by mouth daily.   12/05/2021   enoxaparin (LOVENOX) 120 MG/0.8ML injection Inject 0.8 mLs (120 mg total) into the skin daily. (Patient taking differently: Inject 120 mg into the skin daily. Takes in the morning.) 30 mL 2 12/04/2021   Semaglutide-Weight Management (WEGOVY Snyder) Inject into the skin once a week.   Past Month   ibuprofen (ADVIL) 800 MG tablet Take 1 tablet (800 mg total) by mouth 3 (three) times daily. (Patient not taking: Reported on 12/04/2021) 30 tablet 0 Not Taking   misoprostol (CYTOTEC) 200 MCG tablet Take 4 tablets (800 mcg total) by mouth 4 (four) times daily. (Patient not taking: Reported on 12/04/2021) 4 tablet 0 Not Taking   valACYclovir (VALTREX) 1000 MG tablet as needed.   More than a month    Review of Systems  Blood pressure (!) 155/85, pulse 80, temperature 97.6 F (36.4 C), temperature source Oral, resp. rate 18, height 5\' 11"  (1.803 m), weight 127.4 kg, last menstrual period 08/20/2021, SpO2 100 %, unknown if currently breastfeeding. Physical Exam Constitutional:      Appearance: She is obese.  Cardiovascular:     Rate and Rhythm: Normal rate and regular rhythm.  Pulmonary:     Effort: Pulmonary effort is normal.     Breath sounds: Normal breath sounds.  Neurological:     General: No  focal deficit present.     Mental Status: She is alert.  Psychiatric:        Mood and Affect: Mood normal.    No results found for this or any previous visit (from the past 24 hour(s)).  No results found.  Assessment/Plan: 24 you G1 with missed abortion of twin pregnancy, failed outpatient treatment here for definitive surgical treatment.  Risks, benefits discussed.  Pt has decided she wants chromosomal studies done on the pregnancy.  Anora testing added to consent and consent signed.  Pt ready to proceed.  Jerene Bears 12/05/2021, 8:00 AM

## 2021-12-05 NOTE — Anesthesia Postprocedure Evaluation (Signed)
Anesthesia Post Note  Patient: Marissa Hansen  Procedure(s) Performed: DILATATION AND EVACUATION (Uterus)     Patient location during evaluation: PACU Anesthesia Type: General Level of consciousness: awake and alert Pain management: pain level controlled Vital Signs Assessment: post-procedure vital signs reviewed and stable Respiratory status: spontaneous breathing, nonlabored ventilation and respiratory function stable Cardiovascular status: blood pressure returned to baseline Postop Assessment: no apparent nausea or vomiting Anesthetic complications: no   No notable events documented.  Last Vitals:  Vitals:   12/05/21 1004 12/05/21 1015  BP: 140/69 (!) 147/74  Pulse: (!) 107 91  Resp: 16 18  Temp: (!) 36.4 C   SpO2: 99% 100%    Last Pain:  Vitals:   12/05/21 0744  TempSrc: Oral  PainSc: 0-No pain                 Marthenia Rolling

## 2021-12-05 NOTE — Transfer of Care (Signed)
Immediate Anesthesia Transfer of Care Note  Patient: Marissa Hansen  Procedure(s) Performed: Procedure(s) (LRB): DILATATION AND EVACUATION (N/A)  Patient Location: PACU  Anesthesia Type: General  Level of Consciousness: awake, oriented, sedated and patient cooperative  Airway & Oxygen Therapy: Patient Spontanous Breathing and Patient connected to face mask oxygen  Post-op Assessment: Report given to PACU RN and Post -op Vital signs reviewed and stable  Post vital signs: Reviewed and stable  Complications: No apparent anesthesia complications Last Vitals:  Vitals Value Taken Time  BP 140/69 12/05/21 1006  Temp 36.4 C 12/05/21 1004  Pulse 101 12/05/21 1008  Resp 15 12/05/21 1008  SpO2 100 % 12/05/21 1008  Vitals shown include unvalidated device data.  Last Pain:  Vitals:   12/05/21 0744  TempSrc: Oral  PainSc: 0-No pain      Patients Stated Pain Goal: 8 (12/05/21 0744)  Complications: No notable events documented.

## 2021-12-05 NOTE — Discharge Instructions (Addendum)
Post-surgical Instructions, Outpatient Surgery  You may expect to feel dizzy, weak, and drowsy for as long as 24 hours after receiving the medicine that made you sleep (anesthetic). For the first 24 hours after your surgery:   Do not drive a car, ride a bicycle, participate in physical activities, or take public transportation until you are done taking narcotic pain medicines or as directed by Dr. Sabra Heck.  Do not drink alcohol or take tranquilizers.  Do not take medicine that has not been prescribed by your physicians.  Do not sign important papers or make important decisions while on narcotic pain medicines.  Have a responsible person with you.   PAIN MANAGEMENT Motrin 800mg .  (This is the same as 4-200mg  over the counter tablets of Motrin or ibuprofen.)  You may take this every eight hours or as needed for cramping.   Vicodin 5/325mg .  For more severe pain, take one or two tablets every four to six hours as needed for pain control.  (Remember that narcotic pain medications increase your risk of constipation.  If this becomes a problem, you may take an over the counter stool softener like Colace 100mg  up to four times a day.)  DO'S AND DON'T'S Do not take a tub bath for one week.  You may shower on the first day after your surgery Do not do any heavy lifting for one week, if possible.  I know you may need to do some lifting at work.  Just try and limit this if you can.  This increases the chance of bleeding. Do move around as you feel able.  Stairs are fine.  You may begin to exercise again as you feel able.  Do not lift any weights for two weeks. Do not put anything in the vagina for two weeks--no tampons, intercourse, or douching.    REGULAR MEDIATIONS/VITAMINS: You may restart all of your regular medications as prescribed. You may restart all of your vitamins as you normally take them.   Finish using the Lovenox you have and then transition to Eliquis in the dosing you were taking prior to  pregnancy.  PLEASE CALL OR SEEK MEDICAL CARE IF: You have persistent nausea and vomiting.  You have trouble eating or drinking.  You have an oral temperature above 100.5.  You have constipation that is not helped by adjusting diet or increasing fluid intake. Pain medicines are a common cause of constipation.  You have heavy vaginal bleeding  Post Anesthesia Home Care Instructions  Activity: Get plenty of rest for the remainder of the day. A responsible individual must stay with you for 24 hours following the procedure.  For the next 24 hours, DO NOT: -Drive a car -Paediatric nurse -Drink alcoholic beverages -Take any medication unless instructed by your physician -Make any legal decisions or sign important papers.  Meals: Start with liquid foods such as gelatin or soup. Progress to regular foods as tolerated. Avoid greasy, spicy, heavy foods. If nausea and/or vomiting occur, drink only clear liquids until the nausea and/or vomiting subsides. Call your physician if vomiting continues.  Special Instructions/Symptoms: Your throat may feel dry or sore from the anesthesia or the breathing tube placed in your throat during surgery. If this causes discomfort, gargle with warm salt water. The discomfort should disappear within 24 hours.  If you had a scopolamine patch placed behind your ear for the management of post- operative nausea and/or vomiting:  1. The medication in the patch is effective for 72 hours, after which  it should be removed.  Wrap patch in a tissue and discard in the trash. Wash hands thoroughly with soap and water. 2. You may remove the patch earlier than 72 hours if you experience unpleasant side effects which may include dry mouth, dizziness or visual disturbances. 3. Avoid touching the patch. Wash your hands with soap and water after contact with the patch.    Remove patch behind left ear by Saturday, Dec 08, 2021. May have next dose of Tylenol at 2 PM. Dose given  before surgery at 7:49 AM.

## 2021-12-05 NOTE — Op Note (Signed)
12/05/2021  10:11 AM  PATIENT:  Marissa Hansen  31 y.o. female  PRE-OPERATIVE DIAGNOSIS:  missed ab, failed outpatient treatment  POST-OPERATIVE DIAGNOSIS:  missed ab  PROCEDURE:  Procedure(s): DILATATION AND EVACUATION ANORA chromosomal testing on POCs  SURGEON:  Jerene Bears  ASSISTANTS: OR staff.    ANESTHESIA:   general  ESTIMATED BLOOD LOSS: 75 mL  BLOOD ADMINISTERED:none   FLUIDS: 700cc LR  UOP: pt voided before going back to the OR  SPECIMEN:  POCs  DISPOSITION OF SPECIMEN:   Pathology and [place] for chromosomal studies  FINDINGS: 10 week sized uterus on exam, mobile  DESCRIPTION OF OPERATION: Patient was taken to the operating room.  She is placed in the supine position. SCDs were on her lower extremities and functioning properly. General anesthesia with an LMA was administered without difficulty. Dr. Stephannie Peters, anesthesia, oversaw case.  Time out performed.  Legs were then placed in the [facility] stirrups in the low lithotomy position. The legs were lifted to the high lithotomy position and the Betadine prep was used on the inner thighs perineum and vagina x3. Patient was draped in a normal standard fashion. A bivalve speculum was placed the vagina. The anterior lip of the cervix was grasped with single-tooth tenaculum.  A paracervical block of 1% lidocaine mixed one-to-one with epinephrine (1:100,000 units).  10 cc was used total. The cervix was already dilated.  A #21 Pratt dilator was easily passed through the cervix.  A #7 curved suction tip was obtained.  This was passed to the fundus, suction applied, and the tip rotated in a clockwise fashion.  This was done twice.  Then using a #1 toothed curetted, the cavity was curetted until a rough gritty texture was noted in all quadrants.  Then the suction tip was passed again, suction applied and rotated on a clock wise fashion.  White tissue was seen in the suction tip with the first two passes but not with the second.  One  final curetting was performed.  Minimal bleeding was noted.     At this point, the procedure was ended.  Tenaculum was removed.  Minimal bleeding noted at tenaculum site.  All instrumented were removed.   The prep was cleansed of the patient's skin. The legs are positioned back in the supine position. Sponge, lap, needle, initially counts were correct x2. Patient was taken to recovery in stable condition.   COUNTS:  YES  PLAN OF CARE: Transfer to PACU

## 2021-12-05 NOTE — Anesthesia Procedure Notes (Signed)
Procedure Name: LMA Insertion Date/Time: 12/05/2021 9:21 AM Performed by: Francie Massing, CRNA Pre-anesthesia Checklist: Patient identified, Emergency Drugs available, Suction available and Patient being monitored Patient Re-evaluated:Patient Re-evaluated prior to induction Oxygen Delivery Method: Circle system utilized Preoxygenation: Pre-oxygenation with 100% oxygen Induction Type: IV induction Ventilation: Mask ventilation without difficulty LMA: LMA inserted LMA Size: 4.0 Number of attempts: 1 Airway Equipment and Method: Bite block Placement Confirmation: positive ETCO2 Tube secured with: Tape Dental Injury: Teeth and Oropharynx as per pre-operative assessment

## 2021-12-06 ENCOUNTER — Encounter (HOSPITAL_BASED_OUTPATIENT_CLINIC_OR_DEPARTMENT_OTHER): Payer: Self-pay | Admitting: Obstetrics & Gynecology

## 2021-12-06 LAB — SURGICAL PATHOLOGY

## 2021-12-20 ENCOUNTER — Encounter (HOSPITAL_BASED_OUTPATIENT_CLINIC_OR_DEPARTMENT_OTHER): Payer: Self-pay | Admitting: *Deleted

## 2022-01-14 ENCOUNTER — Encounter: Payer: BC Managed Care – PPO | Admitting: Hematology & Oncology

## 2022-01-14 ENCOUNTER — Other Ambulatory Visit: Payer: BC Managed Care – PPO

## 2022-01-16 ENCOUNTER — Ambulatory Visit (INDEPENDENT_AMBULATORY_CARE_PROVIDER_SITE_OTHER): Payer: BC Managed Care – PPO | Admitting: Obstetrics & Gynecology

## 2022-01-16 ENCOUNTER — Encounter (HOSPITAL_BASED_OUTPATIENT_CLINIC_OR_DEPARTMENT_OTHER): Payer: Self-pay | Admitting: Obstetrics & Gynecology

## 2022-01-16 ENCOUNTER — Other Ambulatory Visit (HOSPITAL_COMMUNITY)
Admission: RE | Admit: 2022-01-16 | Discharge: 2022-01-16 | Disposition: A | Discharge status: Home / Self Care | Payer: BC Managed Care – PPO | Source: Ambulatory Visit | Attending: Obstetrics & Gynecology | Admitting: Obstetrics & Gynecology

## 2022-01-16 VITALS — BP 133/77 | HR 84 | Ht 71.0 in | Wt 293.2 lb

## 2022-01-16 DIAGNOSIS — Z8759 Personal history of other complications of pregnancy, childbirth and the puerperium: Secondary | ICD-10-CM

## 2022-01-16 DIAGNOSIS — Z30011 Encounter for initial prescription of contraceptive pills: Secondary | ICD-10-CM | POA: Diagnosis not present

## 2022-01-16 DIAGNOSIS — Z124 Encounter for screening for malignant neoplasm of cervix: Secondary | ICD-10-CM | POA: Insufficient documentation

## 2022-01-16 MED ORDER — NORETHINDRONE 0.35 MG PO TABS: 1.0000 | ORAL_TABLET | Freq: Every day | ORAL | 3 refills | Status: DC

## 2022-01-17 LAB — CYTOLOGY - PAP
Comment: NEGATIVE
Diagnosis: NEGATIVE
High risk HPV: NEGATIVE

## 2022-01-18 NOTE — Progress Notes (Signed)
GYNECOLOGY  VISIT  CC:   post op recheck/needs pap smear/discuss contraception  HPI: 31 y.o. G1P0 Single Black or [place] American female here for recheck after undergoing suction D&C on 12/05/2021.  Pathology and Anora testing discussed.  None of this was abnormal.  She had minimal bleeding after the procedure.  She has had a normal cycle.  She is back on her eliquis.    She is still working on her weight loss.  She thinks, for now, she is going to try for pregnancy again next year.  We discussed benefits of weight loss vs trying now.  Biggest risk for pt for pregnancy is h/o PE but she will be on full anticoagulation with Lovenox just like with this past pregnancy.    For now, she would like to consider contraception.  She desires a pill.  POP only discussed.  Not interested in more long acting contraception at this time.    She does need a Pap smear today.  MEDS:   Current Outpatient Medications on File Prior to Visit  Medication Sig Dispense Refill   amLODipine (NORVASC) 2.5 MG tablet Take 2.5 mg by mouth daily.     apixaban (ELIQUIS) 2.5 MG TABS tablet Take by mouth 2 (two) times daily.     ibuprofen (ADVIL) 800 MG tablet Take 1 tablet by mouth 3 times daily. 30 tablet 0   Semaglutide-Weight Management (WEGOVY [place]) Inject into the skin once a week.     valACYclovir (VALTREX) 1000 MG tablet as needed.     No current facility-administered medications on file prior to visit.    SH:  Smoking No    PHYSICAL EXAMINATION:    BP 133/77 (BP Location: Right Arm, Patient Position: Sitting, Cuff Size: Large)   Pulse 84   Ht 5\' 11"  (1.803 m) Comment: Reported  Wt 293 lb 3.2 oz (133 kg)   BMI 40.89 kg/m     General appearance: alert, cooperative and appears stated age  Pelvic: External genitalia:  no lesions              Urethra:  normal appearing urethra with no masses, tenderness or lesions              Bartholins and Skenes: normal                 Vagina: normal appearing vagina with  normal color and discharge, no lesions              Cervix: no lesions              Bimanual Exam:  Uterus:  normal size, contour, position, consistency, mobility, non-tender              Adnexa: no mass, fullness, tenderness  Chaperone, , RN, present for exam.  Assessment/Plan: 1. History of miscarriage - doing well post op  2. Cervical cancer screening - Cytology - PAP( [facility])  3. Encounter for initial prescription of contraceptive pills - norethindrone (MICRONOR) 0.35 MG tablet; Take 1 tablet (0.35 mg total) by mouth daily.  Dispense: 84 tablet; Refill: 3

## 2022-07-31 IMAGING — US US EXTREM LOW VENOUS*L*
1 series · 13 of 24 positions shown · non-contrast
Comparison: Contralateral right lower extremity Doppler ultrasound
01/05/2019

CLINICAL DATA: Left lower extremity pain

EXAM:
LEFT LOWER EXTREMITY VENOUS DOPPLER ULTRASOUND
TECHNIQUE: Gray-scale sonography with compression, as well as color and duplex
ultrasound, were performed to evaluate the deep venous system(s)
from the level of the common femoral vein through the popliteal and
proximal calf veins.

[Series 1: us extrem low venous*left* · 13 of 39 slices shown]
[im 1/39]
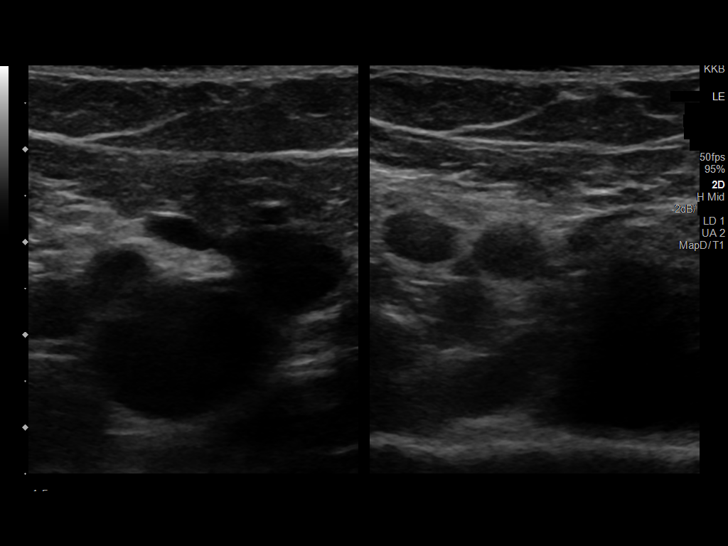
[im 4/39]
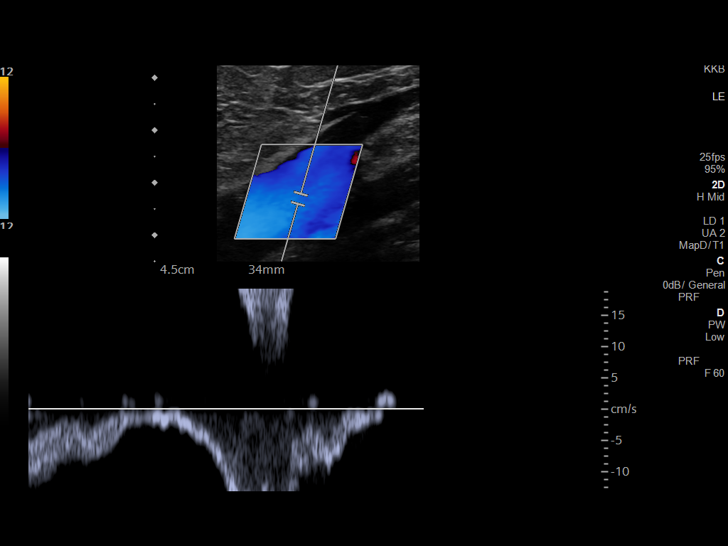
[im 7/39]
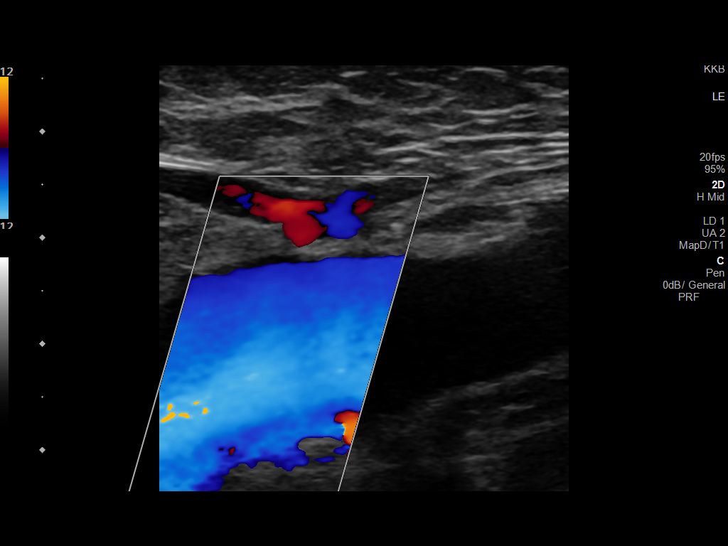
[im 10/39]
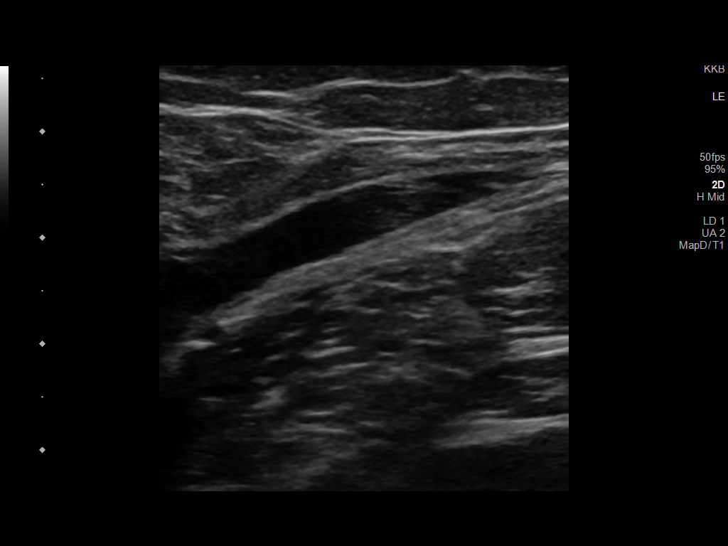
[im 14/39]
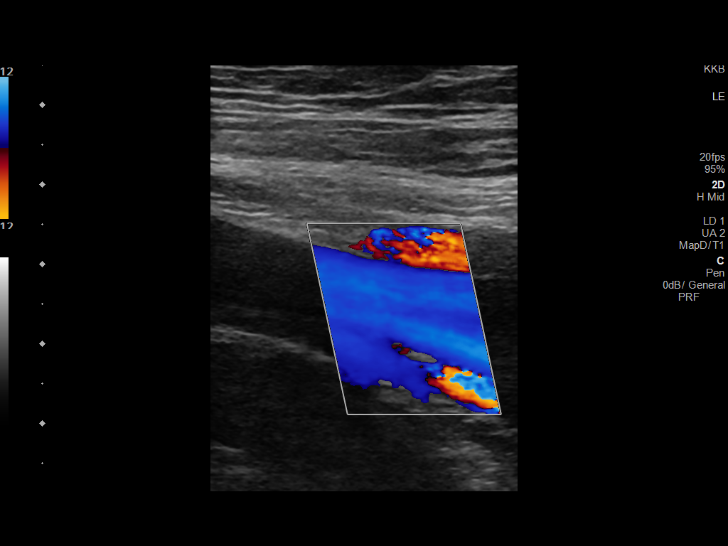
[im 17/39]
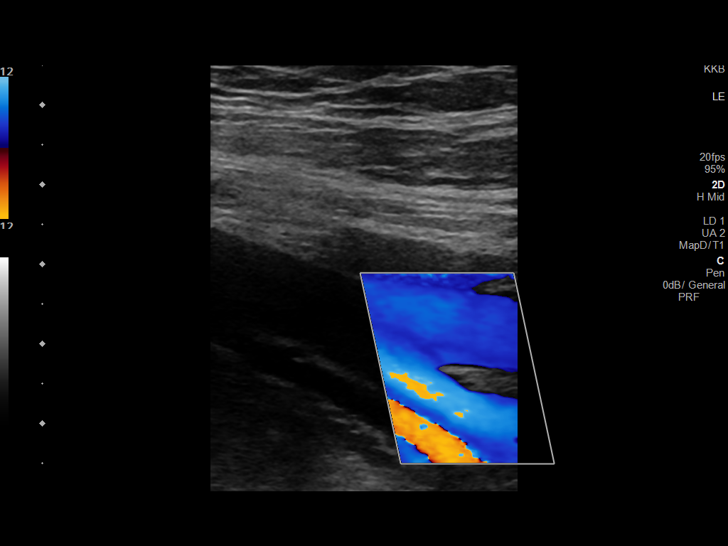
[im 20/39]
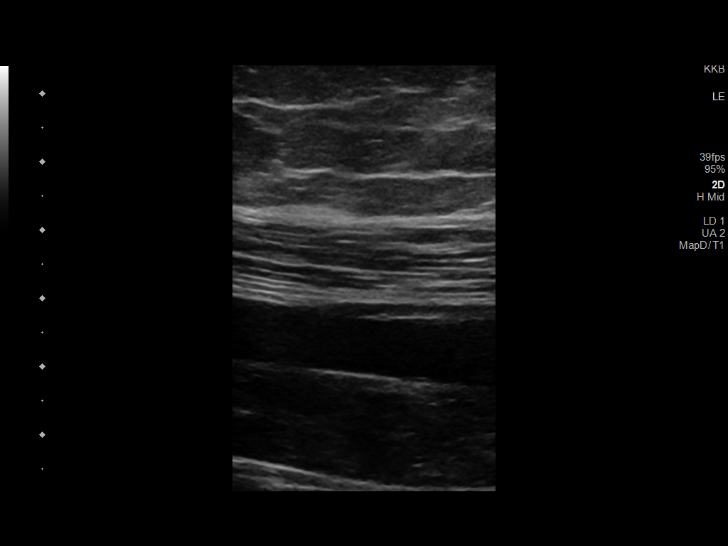
[im 22/39]
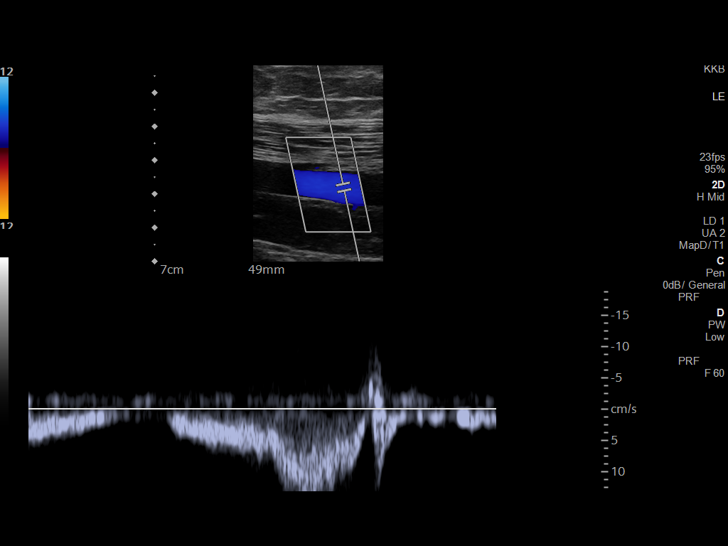
[im 25/39]
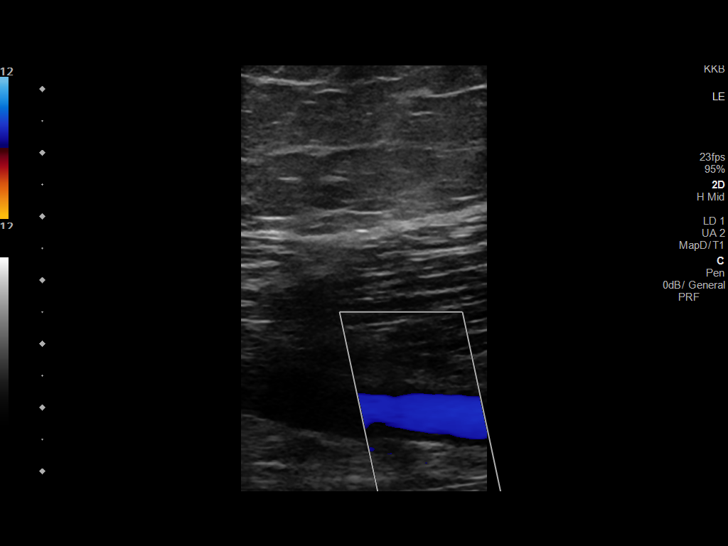
[im 29/39]
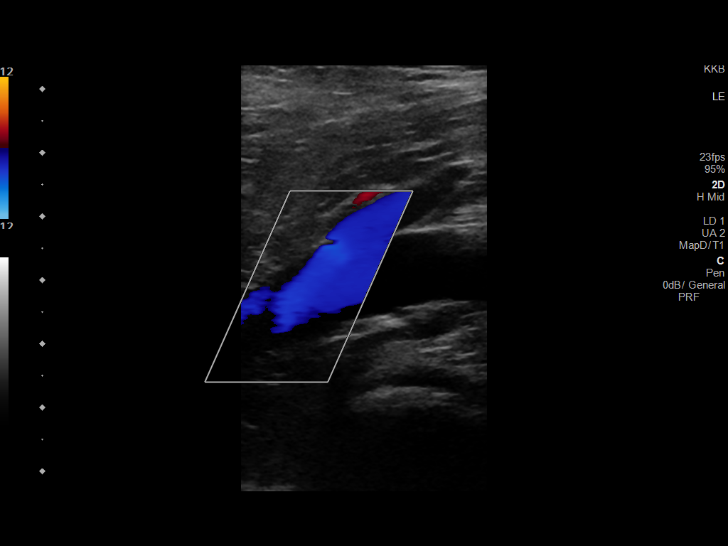
[im 32/39]
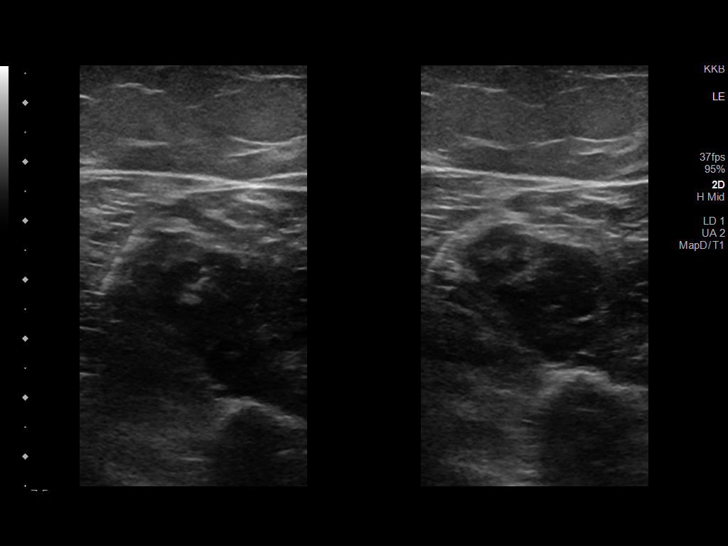
[im 35/39]
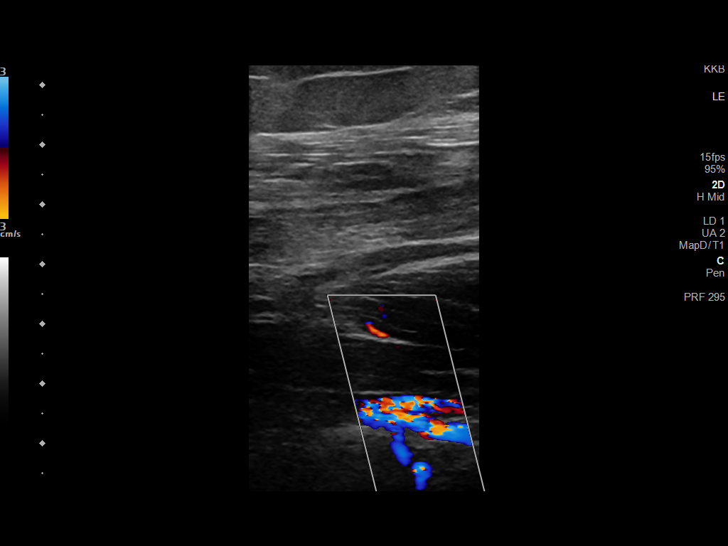
[im 39/39]
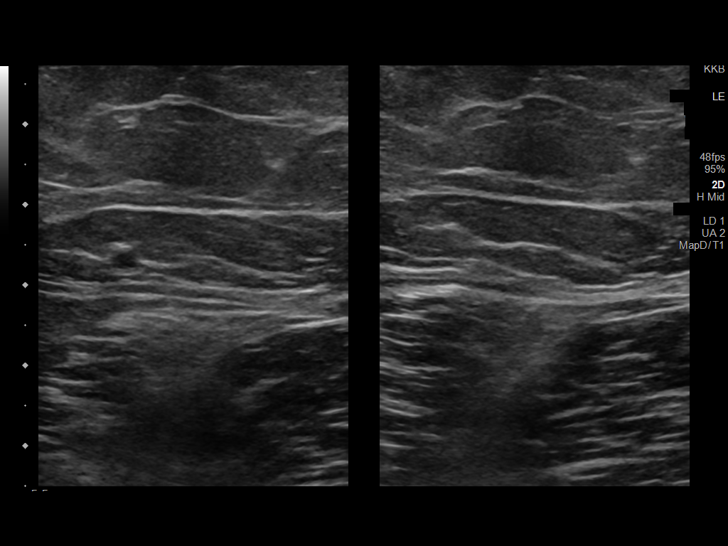

[13 of 24 positions shown; findings below may reference images not displayed]

FINDINGS: VENOUS

Normal compressibility of the common femoral, superficial femoral,
and popliteal veins, as well as the visualized calf veins.
Visualized portions of profunda femoral vein and great saphenous
vein unremarkable. No filling defects to suggest DVT on grayscale or
color Doppler imaging. Doppler waveforms show normal direction of
venous flow, normal respiratory plasticity and response to
augmentation.

Limited views of the contralateral common femoral vein are
unremarkable.

OTHER

None.

Limitations: none
IMPRESSION: No femoropopliteal DVT nor evidence of DVT within the visualized
calf veins. If clinical symptoms are inconsistent or if there are
persistent or worsening symptoms, further imaging (possibly
involving the iliac veins) may be warranted.

## 2023-01-15 ENCOUNTER — Other Ambulatory Visit (HOSPITAL_BASED_OUTPATIENT_CLINIC_OR_DEPARTMENT_OTHER): Payer: Self-pay | Admitting: Obstetrics & Gynecology

## 2023-01-15 MED ORDER — ENOXAPARIN SODIUM 100 MG/ML IJ SOSY: 100.0000 mg | PREFILLED_SYRINGE | INTRAMUSCULAR | 2 refills | Status: DC

## 2023-01-22 ENCOUNTER — Ambulatory Visit (INDEPENDENT_AMBULATORY_CARE_PROVIDER_SITE_OTHER): Payer: 59 | Admitting: *Deleted

## 2023-01-22 ENCOUNTER — Encounter (HOSPITAL_BASED_OUTPATIENT_CLINIC_OR_DEPARTMENT_OTHER): Payer: Self-pay

## 2023-01-22 ENCOUNTER — Ambulatory Visit (INDEPENDENT_AMBULATORY_CARE_PROVIDER_SITE_OTHER): Payer: 59

## 2023-01-22 ENCOUNTER — Other Ambulatory Visit (HOSPITAL_COMMUNITY)
Admission: RE | Admit: 2023-01-22 | Discharge: 2023-01-22 | Disposition: A | Discharge status: Home / Self Care | Payer: 59 | Source: Ambulatory Visit | Attending: Obstetrics & Gynecology | Admitting: Obstetrics & Gynecology

## 2023-01-22 VITALS — BP 129/74 | HR 75 | Wt 230.2 lb

## 2023-01-22 DIAGNOSIS — O0991 Supervision of high risk pregnancy, unspecified, first trimester: Secondary | ICD-10-CM

## 2023-01-22 DIAGNOSIS — O3680X1 Pregnancy with inconclusive fetal viability, fetus 1: Secondary | ICD-10-CM | POA: Diagnosis not present

## 2023-01-22 DIAGNOSIS — Z3A01 Less than 8 weeks gestation of pregnancy: Secondary | ICD-10-CM

## 2023-01-22 DIAGNOSIS — Z3A10 10 weeks gestation of pregnancy: Secondary | ICD-10-CM | POA: Diagnosis not present

## 2023-01-22 DIAGNOSIS — O3680X0 Pregnancy with inconclusive fetal viability, not applicable or unspecified: Secondary | ICD-10-CM

## 2023-01-22 DIAGNOSIS — O0990 Supervision of high risk pregnancy, unspecified, unspecified trimester: Secondary | ICD-10-CM

## 2023-01-23 ENCOUNTER — Encounter (HOSPITAL_BASED_OUTPATIENT_CLINIC_OR_DEPARTMENT_OTHER): Payer: Self-pay | Admitting: *Deleted

## 2023-01-23 DIAGNOSIS — O0990 Supervision of high risk pregnancy, unspecified, unspecified trimester: Secondary | ICD-10-CM | POA: Insufficient documentation

## 2023-01-23 LAB — CERVICOVAGINAL ANCILLARY ONLY
Chlamydia: NEGATIVE
Comment: NEGATIVE
Comment: NORMAL
Neisseria Gonorrhea: NEGATIVE

## 2023-01-23 NOTE — Progress Notes (Signed)
New OB Intake  I explained I am completing New OB Intake today. We discussed EDD of 08/30/2023, by Last Menstrual Period. Pt is G2P0. I reviewed her allergies, medications and Medical/Surgical/OB history.    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 Discussed the nature of our practice with multiple providers including residents and students. Due to the size of the practice, the delivering provider may not be the same as those providing prenatal care.   Patient is not interested in water birth. Offered upcoming OB visit with CNM to discuss further.  MyChart/Babyscripts MyChart access verified. I explained pt will have some visits in office and some virtually. Babyscripts instructions given and order placed. Patient verifies receipt of registration text/e-mail. Account successfully created and app downloaded.  Blood Pressure Cuff/Weight Scale Patient has private insurance; instructed to purchase blood pressure cuff and bring to first prenatal appt. Explained after first prenatal appt pt will check weekly and document in Babyscripts. Patient does have weight scale.  Anatomy US Explained first scheduled [place] will be around 19 weeks.   Is patient a CenteringPregnancy candidate?  Declined Declined due to Group setting Not a candidate due to [facility], medication controlled   Is patient a Mom+Baby Combined Care candidate?  Declined      First visit review I reviewed new OB appt with patient. Explained pt will be seen by Dr.Miller at first visit. Discussed Avelina Laine genetic screening with patient. Pt desires both [employer] and [employer]. Routine prenatal labs  needed at new OB visit    Last Pap Diagnosis  Date Value Ref  Range Status  01/16/2022   Final   - Negative for intraepithelial lesion or malignancy (NILM)    Harrie Jeans, RN 01/23/2023  12:41 PM

## 2023-01-25 LAB — URINE CULTURE

## 2023-01-27 ENCOUNTER — Telehealth: Payer: Self-pay | Admitting: Clinical

## 2023-01-27 NOTE — Telephone Encounter (Signed)
Attempt call regarding referral; Left HIPPA-compliant message to call back Jamie from [facility] at  [phone] (Jamie's office).    

## 2023-01-29 DIAGNOSIS — O283 Abnormal ultrasonic finding on antenatal screening of mother: Secondary | ICD-10-CM | POA: Insufficient documentation

## 2023-01-29 DIAGNOSIS — O99210 Obesity complicating pregnancy, unspecified trimester: Secondary | ICD-10-CM | POA: Insufficient documentation

## 2023-02-04 ENCOUNTER — Encounter: Payer: Self-pay | Admitting: *Deleted

## 2023-02-04 ENCOUNTER — Ambulatory Visit (HOSPITAL_BASED_OUTPATIENT_CLINIC_OR_DEPARTMENT_OTHER): Payer: 59 | Admitting: Maternal & Fetal Medicine

## 2023-02-04 ENCOUNTER — Ambulatory Visit: Payer: 59 | Attending: Obstetrics & Gynecology

## 2023-02-04 ENCOUNTER — Other Ambulatory Visit (HOSPITAL_BASED_OUTPATIENT_CLINIC_OR_DEPARTMENT_OTHER): Payer: Self-pay | Admitting: Obstetrics & Gynecology

## 2023-02-04 ENCOUNTER — Other Ambulatory Visit: Payer: Self-pay | Admitting: *Deleted

## 2023-02-04 ENCOUNTER — Ambulatory Visit: Payer: 59 | Admitting: *Deleted

## 2023-02-04 VITALS — BP 118/65 | HR 76

## 2023-02-04 DIAGNOSIS — O0991 Supervision of high risk pregnancy, unspecified, first trimester: Secondary | ICD-10-CM

## 2023-02-04 DIAGNOSIS — O09291 Supervision of pregnancy with other poor reproductive or obstetric history, first trimester: Secondary | ICD-10-CM

## 2023-02-04 DIAGNOSIS — O0990 Supervision of high risk pregnancy, unspecified, unspecified trimester: Secondary | ICD-10-CM | POA: Insufficient documentation

## 2023-02-04 DIAGNOSIS — Z86718 Personal history of other venous thrombosis and embolism: Secondary | ICD-10-CM

## 2023-02-04 DIAGNOSIS — Z363 Encounter for antenatal screening for malformations: Secondary | ICD-10-CM

## 2023-02-04 DIAGNOSIS — O99211 Obesity complicating pregnancy, first trimester: Secondary | ICD-10-CM

## 2023-02-04 DIAGNOSIS — E669 Obesity, unspecified: Secondary | ICD-10-CM

## 2023-02-04 DIAGNOSIS — O283 Abnormal ultrasonic finding on antenatal screening of mother: Secondary | ICD-10-CM | POA: Diagnosis present

## 2023-02-04 DIAGNOSIS — O10011 Pre-existing essential hypertension complicating pregnancy, first trimester: Secondary | ICD-10-CM

## 2023-02-04 DIAGNOSIS — Z7901 Long term (current) use of anticoagulants: Secondary | ICD-10-CM

## 2023-02-04 DIAGNOSIS — O3509X0 Maternal care for (suspected) other central nervous system malformation or damage in fetus, not applicable or unspecified: Secondary | ICD-10-CM | POA: Diagnosis not present

## 2023-02-04 DIAGNOSIS — Z86711 Personal history of pulmonary embolism: Secondary | ICD-10-CM

## 2023-02-04 DIAGNOSIS — Z3A10 10 weeks gestation of pregnancy: Secondary | ICD-10-CM | POA: Diagnosis not present

## 2023-02-04 DIAGNOSIS — O99210 Obesity complicating pregnancy, unspecified trimester: Secondary | ICD-10-CM | POA: Diagnosis present

## 2023-02-04 DIAGNOSIS — O10919 Unspecified pre-existing hypertension complicating pregnancy, unspecified trimester: Secondary | ICD-10-CM

## 2023-02-04 NOTE — Progress Notes (Signed)
Patient information  Patient Name: Marissa Hansen  Patient MRN:   [id number]  Referring practice: [facility]FM Referring Provider: [facility]  MFM CONSULT  Marissa Hansen is a 32 y.o. G2P0 at [redacted]w[redacted]d here for ultrasound and consultation.   RE hx of PE: Patient reports a history of a DVT and pulmonary embolism in 2020.  This occurred while she took a road trip to [place]. [place] for 12 hours in a car while taking combined contraceptive pills for PCOS.  The DVT was right-sided and the pulmonary embolism was bilateral but without cor pulmonale.  She was placed on Eliquis 5 mg twice a day.  Notably her sister died suddenly of a pulmonary embolism at age 85.  A thrombophilia workup was completed revealing a negative factor V Leiden, negative antiphospholipid syndrome panel but had a slightly low protein S while on anticoagulation.  There is some concern that COVID-19 infection may have played a role in the development of the venous thromboembolism although she was fairly asymptomatic at the time.  At that time she was overweight and fairly sedentary as well.  RE abnormal fetal ultrasound: The patient reports that there was concern on the early ultrasound for abnormal central nervous system findings.  I reviewed the images and it appears the fetus has normal-appearing central nervous system brain vesicles.  However, the patient is very anxious about fetal development and since the images are somewhat limited due to gestational age she would feel comfortable with a repeat ultrasound in 3 to 4 weeks to assess the early fetal anatomy.  RE hx of SAB: The patient endorsed a history of 1 miscarriage less than 10 weeks.  She had an aura testing that was reportedly negative per the patient.  I discussed that she does not meet criteria for recurrent pregnancy loss workup but she already had negative antiphospholipid testing which would be part of this workup if it were indicated.  RE HTN: Patient has a history  of hypertension and was on Procardia but discontinued due to low blood pressure.  Today her blood pressure is 118/65.  We discussed blood pressure goals of less than 140/90.  She is currently compliant with aspirin therapy as well.  Assessment - History of DVT/PE - History of SAB - Chronic hypertension  - Abnormal fetal ultrasound (suspect normal finding)  Plan - EDD should be 08/30/2023 based on  LMP  (11/23/22). -If not previously completed a full thrombophilia workup should be done.  Per the records I can see she had a negative APS workup, negative for factor V Leiden and there was a report of a low protein S while on anticoagulation.  However I do not see any testing for prothrombin gene mutation, Antithrombin gene mutation or protein C.  If these have not been previously done this should be completed.  If testing is done during pregnancy or while on anticoagulation please keep in mind that some thrombophilia testing is not reliable.  Protein C deficiency, protein S deficiency, Antithrombin deficiency are not reliable when on anticoagulation.  She could have prothrombin gene mutation testing done if not previously completed while on anticoagulation/pregnant.  -Due to the pharmacokinetics and dynamics of pregnancy consider a weight-based split dose approach to Lovenox treatment during pregnancy.  I recommend 0.5 mg/kg twice daily for intermediate prophylaxis.  -Continue 81 mg of aspirin for preeclampsia prophylaxis -Antihypertensive therapy is indicated if the blood pressure is consistently greater than 140/90 -Early 11 to 14-week anatomy ordered due to  concern for potential CNS anomaly (low concern at this time) -Detailed anatomic survey to be completed around 19 weeks  Review of Systems: A review of systems was performed and was negative except per HPI   Vitals and Physical Exam    02/04/2023    8:32 AM 01/22/2023    3:46 PM 01/16/2022    3:24 PM  Vitals with BMI  Height   5\' 11"   Weight   230 lbs 3 oz 293 lbs 3 oz  BMI   40.91  Systolic 118 129 295  Diastolic 65 74 77  Pulse 76 75 84    Sitting comfortably on the sonogram table Nonlabored breathing Normal rate and rhythm Abdomen is nontender  Past pregnancies OB History  Gravida Para Term Preterm AB Living  2            SAB IAB Ectopic Multiple Live Births               # Outcome Date GA Lbr Len/2nd Weight Sex Type Anes PTL Lv  2 Current           1 Gravida              I spent 60 minutes reviewing the patients chart, including labs and images as well as counseling the patient about her medical conditions. Greater than 50% of the time was spent in direct face-to-face patient counseling.  Braxton Feathers  MFM, [facility]   02/04/2023  9:10 AM

## 2023-02-05 ENCOUNTER — Other Ambulatory Visit (HOSPITAL_BASED_OUTPATIENT_CLINIC_OR_DEPARTMENT_OTHER): Payer: Self-pay | Admitting: Obstetrics & Gynecology

## 2023-02-07 ENCOUNTER — Ambulatory Visit (INDEPENDENT_AMBULATORY_CARE_PROVIDER_SITE_OTHER): Payer: 59 | Admitting: Obstetrics & Gynecology

## 2023-02-07 VITALS — BP 135/80 | HR 72 | Ht 71.0 in | Wt 233.4 lb

## 2023-02-07 DIAGNOSIS — O0991 Supervision of high risk pregnancy, unspecified, first trimester: Secondary | ICD-10-CM | POA: Diagnosis not present

## 2023-02-07 DIAGNOSIS — Z86711 Personal history of pulmonary embolism: Secondary | ICD-10-CM | POA: Diagnosis not present

## 2023-02-07 DIAGNOSIS — R768 Other specified abnormal immunological findings in serum: Secondary | ICD-10-CM

## 2023-02-07 DIAGNOSIS — Z3A10 10 weeks gestation of pregnancy: Secondary | ICD-10-CM

## 2023-02-07 DIAGNOSIS — Z6832 Body mass index (BMI) 32.0-32.9, adult: Secondary | ICD-10-CM

## 2023-02-07 DIAGNOSIS — Z86718 Personal history of other venous thrombosis and embolism: Secondary | ICD-10-CM

## 2023-02-07 DIAGNOSIS — O283 Abnormal ultrasonic finding on antenatal screening of mother: Secondary | ICD-10-CM

## 2023-02-07 LAB — HEPATITIS C ANTIBODY: HCV Ab: NEGATIVE

## 2023-02-07 MED ORDER — ENOXAPARIN SODIUM 60 MG/0.6ML IJ SOSY: 60.0000 mg | PREFILLED_SYRINGE | Freq: Two times a day (BID) | INTRAMUSCULAR | 3 refills | Status: DC

## 2023-02-07 MED ORDER — ASPIRIN 81 MG PO TBEC: 81.0000 mg | DELAYED_RELEASE_TABLET | Freq: Every day | ORAL | Status: DC

## 2023-02-07 NOTE — Progress Notes (Unsigned)
History:   Marissa Hansen is a 32 y.o. G2P0 at [redacted]w[redacted]d by LMP being seen today for her first obstetrical visit.  Her obstetrical history is significant for {ob risk factors:10154}. Patient {does/does not:19097} intend to breast feed. Pregnancy history fully reviewed.  Patient reports {sx:14538}.      HISTORY: OB History  Gravida Para Term Preterm AB Living  2 0 0 0 0 0  SAB IAB Ectopic Multiple Live Births  0 0 0 0 0    # Outcome Date GA Lbr Len/2nd Weight Sex Type Anes PTL Lv  2 Current           1 Gravida             Last pap smear was done *** and was {Normal/Abnormal Appearance:21344::"normal"}  Past Medical History:  Diagnosis Date   Asthma    has not had symptoms in years per pt on 12/04/21   Asthma 10/01/2018   COVID 09/2020   no antivirals or infusions, mild symptoms   DVT (deep venous thrombosis) (HCC) 2020   Patient had DVT in her popliteal vein after driving 12 hours to her sister's funeral. Patient had a thrombophilia evaluation that was negative.   Dysrhythmia    PVC's   Hypertension    Insulin resistance    Missed abortion    Palpitations 10/2018   LOV w/ cardiology, 08/14/20 Dr. Rosemary Holms in Epic. 01/25/21 Echo in Epic, EF 61%.   PCOS (polycystic ovarian syndrome)    PE (pulmonary thromboembolism) (HCC) 2020   bilateral   Plantar fasciitis 06/13/2021   left foot   Polycystic ovarian syndrome 10/01/2018   Past Surgical History:  Procedure Laterality Date   DILATION AND EVACUATION N/A 12/05/2021   Procedure: DILATATION AND EVACUATION;  Surgeon: Jerene Bears, MD;  Location: [facility];  Service: Gynecology;  Laterality: N/A;   WISDOM TOOTH EXTRACTION     around 2005   Family History  Adopted: Yes  Problem Relation Age of Onset   Hypertension Mother    Atrial fibrillation Father    Diabetes Maternal Aunt    Social History   Tobacco Use   Smoking status: Never   Smokeless tobacco: Never  Vaping Use   Vaping status: Never  Used  Substance Use Topics   Alcohol use: Not Currently    Comment: very rarely   Drug use: No   No Known Allergies Current Outpatient Medications on File Prior to Visit  Medication Sig Dispense Refill   enoxaparin (LOVENOX) 100 MG/ML injection Inject 1 mL (100 mg total) into the skin daily. 30 mL 2   Semaglutide-Weight Management (WEGOVY [place]) Inject into the skin once a week. (Patient not taking: Reported on 02/07/2023)     valACYclovir (VALTREX) 1000 MG tablet as needed. (Patient not taking: Reported on 02/07/2023)     No current facility-administered medications on file prior to visit.    Review of Systems Pertinent items noted in HPI and remainder of comprehensive ROS otherwise negative.  Physical Exam:  There were no vitals filed for this visit.   ***Bedside Ultrasound for FHR check: Viable intrauterine pregnancy with positive cardiac activity noted, fetal heart rate ***bpm Patient informed that the ultrasound is considered a limited obstetric ultrasound and is not intended to be a complete ultrasound exam.  Patient also informed that the ultrasound is not being completed with the intent of assessing for fetal or placental anomalies or any pelvic abnormalities.  Explained that the purpose of today's  ultrasound is to assess for fetal heart rate.  Patient acknowledges the purpose of the exam and the limitations of the study. General: well-developed, well-nourished female in no acute distress  Breasts:  normal appearance, no masses or tenderness bilaterally  Skin: normal coloration and turgor, no rashes  Neurologic: oriented, normal, negative, normal mood  Extremities: normal strength, tone, and muscle mass, ROM of all joints is normal  HEENT PERRLA, extraocular movement intact and sclera clear, anicteric  Neck supple and no masses  Cardiovascular: regular rate and rhythm  Respiratory:  no respiratory distress, normal breath sounds  Abdomen: soft, non-tender; bowel sounds normal; no  masses,  no organomegaly  Pelvic: normal external genitalia, no lesions, normal vaginal mucosa, normal vaginal discharge, normal cervix, ***pap smear done. 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  Initial labs drawn. Continue prenatal vitamins. Problem list reviewed and updated. Genetic Screening discussed, {Blank multiple:19196::"First trimester screen","Quad screen","NIPS"}: {requests/ordered/declines:14581}. Ultrasound discussed; fetal anatomic survey: {requests/ordered/declines:14581}. Anticipatory guidance about prenatal visits given including labs, ultrasounds, and testing. Discussed usage of Babyscripts and virtual visits as additional source of managing and completing prenatal visits in midst of coronavirus and pandemic.   Encouraged to complete MyChart Registration for her ability to review results, send requests, and have questions addressed.  The nature of [facility]Faculty Practice with multiple MDs and Advanced Practice Providers was explained to patient; also emphasized that residents, students are part of our team. Routine obstetric precautions reviewed. Encouraged to seek out care at office or emergency room [facility] preferred) for urgent and/or emergent concerns. No follow-ups on file.     Lum Keas, MD, FACOG Obstetrician & Gynecologist, [facility], [facility]

## 2023-02-08 ENCOUNTER — Encounter (HOSPITAL_BASED_OUTPATIENT_CLINIC_OR_DEPARTMENT_OTHER): Payer: Self-pay | Admitting: Obstetrics & Gynecology

## 2023-02-27 ENCOUNTER — Ambulatory Visit: Payer: 59 | Admitting: *Deleted

## 2023-02-27 ENCOUNTER — Ambulatory Visit: Payer: 59 | Attending: Maternal & Fetal Medicine

## 2023-02-27 ENCOUNTER — Other Ambulatory Visit: Payer: Self-pay | Admitting: Maternal & Fetal Medicine

## 2023-02-27 VITALS — BP 126/68 | HR 97

## 2023-02-27 DIAGNOSIS — O0990 Supervision of high risk pregnancy, unspecified, unspecified trimester: Secondary | ICD-10-CM | POA: Diagnosis present

## 2023-02-27 DIAGNOSIS — Z363 Encounter for antenatal screening for malformations: Secondary | ICD-10-CM | POA: Insufficient documentation

## 2023-02-27 DIAGNOSIS — O10919 Unspecified pre-existing hypertension complicating pregnancy, unspecified trimester: Secondary | ICD-10-CM

## 2023-02-27 DIAGNOSIS — O10011 Pre-existing essential hypertension complicating pregnancy, first trimester: Secondary | ICD-10-CM

## 2023-02-27 DIAGNOSIS — O09291 Supervision of pregnancy with other poor reproductive or obstetric history, first trimester: Secondary | ICD-10-CM | POA: Diagnosis not present

## 2023-02-27 DIAGNOSIS — O99211 Obesity complicating pregnancy, first trimester: Secondary | ICD-10-CM | POA: Diagnosis not present

## 2023-02-27 DIAGNOSIS — Z3A13 13 weeks gestation of pregnancy: Secondary | ICD-10-CM

## 2023-02-27 DIAGNOSIS — O3500X0 Maternal care for (suspected) central nervous system malformation or damage in fetus, unspecified, not applicable or unspecified: Secondary | ICD-10-CM | POA: Diagnosis not present

## 2023-02-27 DIAGNOSIS — E669 Obesity, unspecified: Secondary | ICD-10-CM

## 2023-03-11 ENCOUNTER — Ambulatory Visit (HOSPITAL_BASED_OUTPATIENT_CLINIC_OR_DEPARTMENT_OTHER): Payer: 59 | Admitting: Obstetrics & Gynecology

## 2023-03-11 VITALS — BP 130/72 | HR 72 | Wt 252.0 lb

## 2023-03-11 DIAGNOSIS — O0990 Supervision of high risk pregnancy, unspecified, unspecified trimester: Secondary | ICD-10-CM

## 2023-03-11 DIAGNOSIS — R635 Abnormal weight gain: Secondary | ICD-10-CM

## 2023-03-11 DIAGNOSIS — O0991 Supervision of high risk pregnancy, unspecified, first trimester: Secondary | ICD-10-CM

## 2023-03-11 DIAGNOSIS — R768 Other specified abnormal immunological findings in serum: Secondary | ICD-10-CM

## 2023-03-11 DIAGNOSIS — O0992 Supervision of high risk pregnancy, unspecified, second trimester: Secondary | ICD-10-CM

## 2023-03-11 DIAGNOSIS — R7689 Other specified abnormal immunological findings in serum: Secondary | ICD-10-CM

## 2023-03-11 DIAGNOSIS — Z86711 Personal history of pulmonary embolism: Secondary | ICD-10-CM

## 2023-03-11 DIAGNOSIS — Z8679 Personal history of other diseases of the circulatory system: Secondary | ICD-10-CM

## 2023-03-11 DIAGNOSIS — Z8639 Personal history of other endocrine, nutritional and metabolic disease: Secondary | ICD-10-CM

## 2023-03-11 DIAGNOSIS — Z3A15 15 weeks gestation of pregnancy: Secondary | ICD-10-CM

## 2023-03-11 DIAGNOSIS — O283 Abnormal ultrasonic finding on antenatal screening of mother: Secondary | ICD-10-CM

## 2023-03-11 MED ORDER — ASPIRIN 81 MG PO TBEC
81.0000 mg | DELAYED_RELEASE_TABLET | Freq: Every day | ORAL | Status: DC
Start: 2023-03-11 — End: 2023-08-15

## 2023-03-11 NOTE — Progress Notes (Unsigned)
PRENATAL VISIT NOTE  Subjective:  Marissa Hansen is a 32 y.o. G3P0010 at [redacted]w[redacted]d being seen today for ongoing prenatal care.  She is currently monitored for the following issues for this high-risk pregnancy and has History of deep vein thrombosis (DVT) of lower extremity; HSV-2 seropositive; Personal history of PE (pulmonary embolism); Supervision of high risk pregnancy, antepartum; Obesity affecting pregnancy, antepartum; and Abnormal fetal ultrasound on their problem list.  Patient reports  she feels hungry all the time and knows she is eating out of control.  Has gained 19 pounds in a month.  States the GLP1 that she was taking is the only thing that ever really controlled her hunger/desire to eat and that is why she thinks she was so successful with it .  Contractions: Not present. Vag. Bleeding: None.  Movement: Absent. Denies leaking of fluid.   Reviewed MFM u/s findings.  First trimester u/s for viability here shows concerns for possible abnormal fluid in fetal head but follow up with MFM reassuring.  NIPS normal.    The following portions of the patient's history were reviewed and updated as appropriate: allergies, current medications, past family history, past medical history, past social history, past surgical history and problem list.   Objective:   Vitals:   03/11/23 1635  BP: 130/72  Pulse: 72  Weight: 252 lb (114.3 kg)    Fetal Status: Fetal Heart Rate (bpm): 160   Movement: Absent     General:  Alert, oriented and cooperative. Patient is in no acute distress.  Skin: Skin is warm and dry. No rash noted.   Cardiovascular: Normal heart rate noted  Respiratory: Normal respiratory effort, no problems with respiration noted  Abdomen: Soft, gravid, appropriate for gestational age.  Pain/Pressure: Absent     Pelvic: Cervical exam performed in the presence of a chaperone        Extremities: Normal range of motion.  Edema: None  Mental Status: Normal mood and affect. Normal  behavior. Normal judgment and thought content.   Assessment and Plan:  Pregnancy: G3P0010 at [redacted]w[redacted]d 1. Supervision of high risk pregnancy in second trimester - on PNV - AFP, Serum, Open Spina Bifida - aspirin EC 81 MG tablet; Take 1 tablet (81 mg total) by mouth daily. Swallow whole.  2. [redacted] weeks gestation of pregnancy - recheck 4 weeks - anatomy scan scheduled with MFM  3. Personal history of PE (pulmonary embolism) - on Lovenox 0.5mg /kg BID dosing.  Calculated dosing today with recent weight gain (57mg  BID).  She is currently on 60mg  BID.  Reviewed with pt will need to review calculation at each visit as this will likely need adjustment in pregnancy.  4. Abnormal fetal ultrasound - MFM initial ultrasound reassuring.  Has anatomy scan scheduled.  5. Weight gain - pt is concerned about this and we discussed options at this time.  Will refer to nutritional counseling for very specific guidelines in pregnancy.  Will have pt start checking fasting blood sugars - Referral to Nutrition and Diabetes Services - Blood Glucose Monitoring Suppl (FREESTYLE LITE) w/Device KIT; Dispense 1 device to check fasting blood sugars and 2hr pp blood sugars  Dispense: 1 kit; Refill: 0 - glucose blood (FREESTYLE LITE) test strip; Use as instructed  Dispense: 100 each; Refill: 12 - Lancets (FREESTYLE) lancets; Use as instructed  Dispense: 100 each; Refill: 12  6. History of hypertension - pt checking blood pressures at home and entering in baby scripts  7. HSV-2 seropositive - will treat  at 36 weeks  Preterm labor symptoms and general obstetric precautions including but not limited to vaginal bleeding, contractions, leaking of fluid and fetal movement were reviewed in detail with the patient. Please refer to After Visit Summary for other counseling recommendations.   Return in about 4 weeks (around 04/08/2023).  Future Appointments  Date Time Provider Department Center  04/07/2023  1:15 PM [facility] NURSE  [facility]  04/07/2023  1:30 PM [facility] US3 [facility]  04/08/2023  9:35 AM Jerene Bears, MD [facility]    Jerene Bears, MD

## 2023-03-12 MED ORDER — FREESTYLE LITE W/DEVICE KIT
PACK | 0 refills | Status: DC
Start: 2023-03-12 — End: 2023-08-15

## 2023-03-12 MED ORDER — FREESTYLE LITE TEST VI STRP
ORAL_STRIP | 12 refills | Status: DC
Start: 2023-03-12 — End: 2023-08-15

## 2023-03-12 MED ORDER — FREESTYLE LANCETS MISC: 12 refills | Status: DC

## 2023-03-13 LAB — AFP, SERUM, OPEN SPINA BIFIDA
AFP MoM: 1.32
AFP Value: 32.2 ng/mL
Gest. Age on Collection Date: 15.3 wk
Maternal Age At EDD: 34.9 a
OSBR Risk 1 IN: 9058
Test Results:: NEGATIVE
Weight: 252 [lb_av]

## 2023-03-28 ENCOUNTER — Encounter (HOSPITAL_BASED_OUTPATIENT_CLINIC_OR_DEPARTMENT_OTHER): Payer: Self-pay | Admitting: Obstetrics & Gynecology

## 2023-03-31 ENCOUNTER — Telehealth: Payer: Self-pay | Admitting: Clinical

## 2023-03-31 NOTE — Telephone Encounter (Signed)
Attempt call regarding referral; Left HIPPA-compliant message to call back Mikle Sternberg from [facility] at  [phone] (Ashelyn Mccravy's office).    

## 2023-04-07 ENCOUNTER — Other Ambulatory Visit: Payer: Self-pay | Admitting: *Deleted

## 2023-04-07 ENCOUNTER — Ambulatory Visit: Payer: 59 | Attending: Maternal & Fetal Medicine

## 2023-04-07 ENCOUNTER — Encounter: Payer: Self-pay | Admitting: *Deleted

## 2023-04-07 ENCOUNTER — Ambulatory Visit: Payer: 59 | Admitting: *Deleted

## 2023-04-07 VITALS — BP 127/70 | HR 81

## 2023-04-07 DIAGNOSIS — O0991 Supervision of high risk pregnancy, unspecified, first trimester: Secondary | ICD-10-CM | POA: Insufficient documentation

## 2023-04-07 DIAGNOSIS — Z362 Encounter for other antenatal screening follow-up: Secondary | ICD-10-CM

## 2023-04-07 DIAGNOSIS — O0990 Supervision of high risk pregnancy, unspecified, unspecified trimester: Secondary | ICD-10-CM | POA: Diagnosis present

## 2023-04-07 DIAGNOSIS — O10919 Unspecified pre-existing hypertension complicating pregnancy, unspecified trimester: Secondary | ICD-10-CM | POA: Insufficient documentation

## 2023-04-07 DIAGNOSIS — E669 Obesity, unspecified: Secondary | ICD-10-CM

## 2023-04-07 DIAGNOSIS — O99212 Obesity complicating pregnancy, second trimester: Secondary | ICD-10-CM | POA: Diagnosis not present

## 2023-04-07 DIAGNOSIS — Z363 Encounter for antenatal screening for malformations: Secondary | ICD-10-CM | POA: Diagnosis present

## 2023-04-07 DIAGNOSIS — O10012 Pre-existing essential hypertension complicating pregnancy, second trimester: Secondary | ICD-10-CM

## 2023-04-07 DIAGNOSIS — Z3A19 19 weeks gestation of pregnancy: Secondary | ICD-10-CM

## 2023-04-07 DIAGNOSIS — Z86718 Personal history of other venous thrombosis and embolism: Secondary | ICD-10-CM

## 2023-04-08 ENCOUNTER — Encounter (HOSPITAL_BASED_OUTPATIENT_CLINIC_OR_DEPARTMENT_OTHER): Payer: Self-pay

## 2023-04-08 ENCOUNTER — Ambulatory Visit (INDEPENDENT_AMBULATORY_CARE_PROVIDER_SITE_OTHER): Payer: 59 | Admitting: Obstetrics & Gynecology

## 2023-04-08 VITALS — BP 135/75 | HR 74 | Wt 249.8 lb

## 2023-04-08 DIAGNOSIS — O0990 Supervision of high risk pregnancy, unspecified, unspecified trimester: Secondary | ICD-10-CM

## 2023-04-08 DIAGNOSIS — R768 Other specified abnormal immunological findings in serum: Secondary | ICD-10-CM

## 2023-04-08 DIAGNOSIS — Z3A19 19 weeks gestation of pregnancy: Secondary | ICD-10-CM

## 2023-04-08 DIAGNOSIS — O283 Abnormal ultrasonic finding on antenatal screening of mother: Secondary | ICD-10-CM

## 2023-04-08 DIAGNOSIS — Z86718 Personal history of other venous thrombosis and embolism: Secondary | ICD-10-CM

## 2023-04-08 DIAGNOSIS — R7401 Elevation of levels of liver transaminase levels: Secondary | ICD-10-CM

## 2023-04-09 LAB — COMPREHENSIVE METABOLIC PANEL
ALT: 29 IU/L (ref 0–32)
AST: 16 IU/L (ref 0–40)
Albumin: 3.6 g/dL — ABNORMAL LOW (ref 3.9–4.9)
Alkaline Phosphatase: 48 IU/L (ref 44–121)
BUN/Creatinine Ratio: 10 (ref 9–23)
BUN: 6 mg/dL (ref 6–20)
Bilirubin Total: 0.2 mg/dL (ref 0.0–1.2)
CO2: 21 mmol/L (ref 20–29)
Calcium: 9 mg/dL (ref 8.7–10.2)
Chloride: 103 mmol/L (ref 96–106)
Creatinine, Ser: 0.61 mg/dL (ref 0.57–1.00)
Globulin, Total: 2.5 g/dL (ref 1.5–4.5)
Glucose: 77 mg/dL (ref 70–99)
Potassium: 4.2 mmol/L (ref 3.5–5.2)
Sodium: 138 mmol/L (ref 134–144)
Total Protein: 6.1 g/dL (ref 6.0–8.5)
eGFR: 120 mL/min/{1.73_m2} (ref >59)

## 2023-04-09 NOTE — Progress Notes (Signed)
   PRENATAL VISIT NOTE  Subjective:  Marissa Hansen is a 32 y.o. G3P0010 at [redacted]w[redacted]d being seen today for ongoing prenatal care.  She is currently monitored for the following issues for this high-risk pregnancy and has History of deep vein thrombosis (DVT) of lower extremity; HSV-2 seropositive; Personal history of PE (pulmonary embolism); Supervision of high risk pregnancy, antepartum; Obesity affecting pregnancy, antepartum; and Abnormal fetal ultrasound on their problem list.  Patient reports no complaints.  Contractions: Irregular. Vag. Bleeding: None.  Movement: Present. Denies leaking of fluid.   The following portions of the patient's history were reviewed and updated as appropriate: allergies, current medications, past family history, past medical history, past social history, past surgical history and problem list.   Objective:   Vitals:   04/08/23 0959  BP: 135/75  Pulse: 74  Weight: 249 lb 12.8 oz (113.3 kg)    Fetal Status: Fetal Heart Rate (bpm): 152   Movement: Present     General:  Alert, oriented and cooperative. Patient is in no acute distress.  Skin: Skin is warm and dry. No rash noted.   Cardiovascular: Normal heart rate noted  Respiratory: Normal respiratory effort, no problems with respiration noted  Abdomen: Soft, gravid, appropriate for gestational age.  Pain/Pressure: Absent     Pelvic: Cervical exam deferred        Extremities: Normal range of motion.  Edema: None  Mental Status: Normal mood and affect. Normal behavior. Normal judgment and thought content.   Assessment and Plan:  Pregnancy: G3P0010 at [redacted]w[redacted]d 1. Supervision of high risk pregnancy, antepartum - on PNV and baby ASA  2. Abnormal fetal ultrasound - anatomy scan yesterday was normal.  Will have follow up 4 weeks to complete anatomy and for growth  3. History of deep vein thrombosis (DVT) of lower extremity - on lovenox 0.5mg /kg BID.  Currently taking 60mg  BID dosing.    4. HSV-2  seropositive - will treat at 36 weeks  5. [redacted] weeks gestation of pregnancy - recheck 4 weeks - pt going on cruise.  Letter written fo rher  6. Elevated ALT measurement - will recheck today - Comprehensive metabolic panel  Preterm labor symptoms and general obstetric precautions including but not limited to vaginal bleeding, contractions, leaking of fluid and fetal movement were reviewed in detail with the patient. Please refer to After Visit Summary for other counseling recommendations.   Return in about 4 weeks (around 05/06/2023).  Future Appointments  Date Time Provider Department Center  05/12/2023  7:30 AM [facility] US5 [facility] Rather [facility]  05/15/2023  8:15 AM Lo, Toma Aran, CNM [facility]  06/06/2023  8:35 AM Jerene Bears, MD [facility]    Jerene Bears, MD

## 2023-04-17 ENCOUNTER — Ambulatory Visit (HOSPITAL_BASED_OUTPATIENT_CLINIC_OR_DEPARTMENT_OTHER): Payer: 59 | Admitting: Certified Nurse Midwife

## 2023-04-17 ENCOUNTER — Encounter (HOSPITAL_BASED_OUTPATIENT_CLINIC_OR_DEPARTMENT_OTHER): Payer: Self-pay

## 2023-04-17 VITALS — BP 127/74 | HR 66 | Ht 71.0 in | Wt 242.0 lb

## 2023-04-17 DIAGNOSIS — O0990 Supervision of high risk pregnancy, unspecified, unspecified trimester: Secondary | ICD-10-CM

## 2023-04-17 DIAGNOSIS — O283 Abnormal ultrasonic finding on antenatal screening of mother: Secondary | ICD-10-CM

## 2023-04-17 DIAGNOSIS — E6609 Other obesity due to excess calories: Secondary | ICD-10-CM

## 2023-04-17 DIAGNOSIS — O99210 Obesity complicating pregnancy, unspecified trimester: Secondary | ICD-10-CM

## 2023-04-17 DIAGNOSIS — Z3A20 20 weeks gestation of pregnancy: Secondary | ICD-10-CM

## 2023-04-17 DIAGNOSIS — Z658 Other specified problems related to psychosocial circumstances: Secondary | ICD-10-CM | POA: Insufficient documentation

## 2023-04-17 DIAGNOSIS — R768 Other specified abnormal immunological findings in serum: Secondary | ICD-10-CM

## 2023-04-17 DIAGNOSIS — Z86718 Personal history of other venous thrombosis and embolism: Secondary | ICD-10-CM

## 2023-04-17 DIAGNOSIS — Z8759 Personal history of other complications of pregnancy, childbirth and the puerperium: Secondary | ICD-10-CM

## 2023-04-17 NOTE — Progress Notes (Signed)
Fetal HR-147 

## 2023-04-17 NOTE — Progress Notes (Signed)
PRENATAL VISIT NOTE  Weight review: PrePregnancy 11/14/22 240lb Weight at Initial Prenatal Visit: 233lb Weight 20w (10/3) 242lb  Subjective:  Marissa Hansen is a 32 y.o. G2P0010 at [redacted]w[redacted]d being seen today for ongoing prenatal care.  She was here today for weight check. Having a son. Reports +fetal movement. Pt states something "an episode" occurred recently within the family that has caused her a great deal of stress and anxiety. Pt did not want to tell me specifically what happened, but did reassure me that she was in no way harmed and she is currently safe. Pt states she has a good support system. Works full-time as a Administrator, [employer]. We had a long discussion about availability of medication (anti-depressant) and/or counseling if desired. States she is "coping" well at this time. Lives with her Marissa Hansen and 1 cat (friendly female cat).  She is eating, appetite is okay. Eats mainly protein and fruit. Denies nausea, vomiting, constipation or diarrhea. No fever or chills. Denies SOB, chest pain, vision changes, pain or discomfort.  She is currently monitored for the following issues for this high-risk pregnancy and has History of deep vein thrombosis (DVT) of lower extremity; HSV-2 seropositive; Personal history of PE (pulmonary embolism); Supervision of high risk pregnancy, antepartum; Obesity affecting pregnancy, antepartum; Abnormal fetal ultrasound; Psychosocial stressors; and History of miscarriage on their problem list.  Patient reports  no physical complaints. She is having difficulty emotionally right now but reports she is coping well and has a good support system .   .  .   . Denies leaking of fluid.   The following portions of the patient's history were reviewed and updated as appropriate: allergies, current medications, past family history, past medical history, past social history, past surgical history and problem list.   Objective:   Vitals:   04/17/23 0830  BP: 127/74  Pulse: 66   Weight: 242 lb (109.8 kg)  Height: 5\' 11"  (1.803 m)    Fetal Status:           General:  Alert, oriented and cooperative. Patient is in no acute distress.  Skin: Skin is warm and dry. No rash noted.   Cardiovascular: Normal heart rate noted  Respiratory: Normal respiratory effort, no problems with respiration noted  Abdomen: Soft, gravid, appropriate for gestational age.        Pelvic: Cervical exam deferred        Extremities: Normal range of motion.     Mental Status: Normal mood and affect. Normal behavior. Normal judgment and thought content.   Assessment and Plan:  Pregnancy: G2P0010 at [redacted]w[redacted]d 1. Psychosocial stressors -Emotional support provided today -Pt aware of availability medication and/or counseling  2. HSV-2 seropositive Plan Valtrex 500mg  po BID at 36wk  3. History of deep vein thrombosis (DVT) of lower extremity On lovenox BID and taking as prescribed  4. Supervision of high risk pregnancy, antepartum Pt has follow-up US scheduled at MFM  5. Other obesity due to excess calories affecting pregnancy, antepartum Pt reassured that she has gained 12lb in pregnancy  Healthy diet encouraged with more calories if possible  6. Abnormal fetal ultrasound Follow-up planned  7. [redacted] weeks gestation of pregnancy   8. History of miscarriage (twins 2023) 7-8 weeks -On Lovenox BID  Preterm labor symptoms and general obstetric precautions including but not limited to vaginal bleeding, contractions, leaking of fluid and fetal movement were reviewed in detail with the patient. Please refer to After Visit Summary for other counseling recommendations.  Future Appointments  Date Time Provider Department Center  05/12/2023  7:30 AM [facility] US5 [facility]  05/15/2023  8:15 AM Aymen Widrig, Toma Aran, CNM [facility]  06/06/2023  8:35 AM Jerene Bears, MD [facility]    Letta Kocher, CNM

## 2023-05-12 ENCOUNTER — Other Ambulatory Visit: Payer: Self-pay | Admitting: *Deleted

## 2023-05-12 ENCOUNTER — Other Ambulatory Visit: Payer: Self-pay

## 2023-05-12 ENCOUNTER — Ambulatory Visit: Payer: 59 | Attending: Obstetrics

## 2023-05-12 ENCOUNTER — Other Ambulatory Visit: Payer: Self-pay | Admitting: Obstetrics

## 2023-05-12 VITALS — BP 130/69

## 2023-05-12 DIAGNOSIS — Z362 Encounter for other antenatal screening follow-up: Secondary | ICD-10-CM

## 2023-05-12 DIAGNOSIS — E669 Obesity, unspecified: Secondary | ICD-10-CM | POA: Diagnosis not present

## 2023-05-12 DIAGNOSIS — O0990 Supervision of high risk pregnancy, unspecified, unspecified trimester: Secondary | ICD-10-CM | POA: Diagnosis present

## 2023-05-12 DIAGNOSIS — O365920 Maternal care for other known or suspected poor fetal growth, second trimester, not applicable or unspecified: Secondary | ICD-10-CM | POA: Diagnosis not present

## 2023-05-12 DIAGNOSIS — Z8679 Personal history of other diseases of the circulatory system: Secondary | ICD-10-CM

## 2023-05-12 DIAGNOSIS — O99212 Obesity complicating pregnancy, second trimester: Secondary | ICD-10-CM | POA: Diagnosis not present

## 2023-05-12 DIAGNOSIS — Z3A24 24 weeks gestation of pregnancy: Secondary | ICD-10-CM | POA: Diagnosis not present

## 2023-05-13 ENCOUNTER — Inpatient Hospital Stay (HOSPITAL_COMMUNITY)
Admission: AD | Admit: 2023-05-13 | Discharge: 2023-05-13 | Disposition: A | Discharge status: Home / Self Care | Payer: 59 | Attending: Obstetrics and Gynecology | Admitting: Obstetrics and Gynecology

## 2023-05-13 DIAGNOSIS — Z86718 Personal history of other venous thrombosis and embolism: Secondary | ICD-10-CM | POA: Insufficient documentation

## 2023-05-13 DIAGNOSIS — O26892 Other specified pregnancy related conditions, second trimester: Secondary | ICD-10-CM | POA: Insufficient documentation

## 2023-05-13 DIAGNOSIS — J45901 Unspecified asthma with (acute) exacerbation: Secondary | ICD-10-CM | POA: Diagnosis present

## 2023-05-13 DIAGNOSIS — R0602 Shortness of breath: Secondary | ICD-10-CM | POA: Insufficient documentation

## 2023-05-13 DIAGNOSIS — O99512 Diseases of the respiratory system complicating pregnancy, second trimester: Secondary | ICD-10-CM | POA: Insufficient documentation

## 2023-05-13 DIAGNOSIS — Z3A24 24 weeks gestation of pregnancy: Secondary | ICD-10-CM

## 2023-05-13 DIAGNOSIS — Z1152 Encounter for screening for COVID-19: Secondary | ICD-10-CM | POA: Insufficient documentation

## 2023-05-13 LAB — RESP PANEL BY RT-PCR (RSV, FLU A&B, COVID)  RVPGX2
Influenza A by PCR: NEGATIVE
Influenza B by PCR: NEGATIVE
Resp Syncytial Virus by PCR: NEGATIVE
SARS Coronavirus 2 by RT PCR: NEGATIVE

## 2023-05-13 MED ORDER — IPRATROPIUM BROMIDE 0.02 % IN SOLN
0.5000 mg | RESPIRATORY_TRACT | Status: DC
  Administered 2023-05-13: 0.5 mg via RESPIRATORY_TRACT
  Filled 2023-05-13 (×2): qty 2.5

## 2023-05-13 MED ORDER — PREDNISONE 20 MG PO TABS: 40.0000 mg | ORAL_TABLET | Freq: Every day | ORAL | 0 refills | Status: DC

## 2023-05-13 MED ORDER — BUDESONIDE-FORMOTEROL FUMARATE 80-4.5 MCG/ACT IN AERO: 2.0000 | INHALATION_SPRAY | Freq: Two times a day (BID) | RESPIRATORY_TRACT | 7 refills | Status: DC

## 2023-05-13 MED ORDER — ALBUTEROL SULFATE (2.5 MG/3ML) 0.083% IN NEBU
2.5000 mg | INHALATION_SOLUTION | RESPIRATORY_TRACT | Status: DC
  Administered 2023-05-13: 2.5 mg via RESPIRATORY_TRACT
  Filled 2023-05-13 (×2): qty 3

## 2023-05-13 MED ORDER — IPRATROPIUM-ALBUTEROL 0.5-2.5 (3) MG/3ML IN SOLN: 3.0000 mL | Freq: Four times a day (QID) | RESPIRATORY_TRACT | 3 refills | Status: DC | PRN

## 2023-05-13 MED ORDER — PREDNISONE 20 MG PO TABS
40.0000 mg | ORAL_TABLET | Freq: Every day | ORAL | Status: DC
  Administered 2023-05-13: 40 mg via ORAL
  Filled 2023-05-13 (×3): qty 2

## 2023-05-13 NOTE — MAU Note (Signed)
Pt breathing easier after nebulizer treatment.

## 2023-05-13 NOTE — MAU Note (Signed)
Marissa Hansen is a 32 y.o. at [redacted]w[redacted]d here in MAU reporting: cough  started yesterday.  Productive, little bit of phlegm. Ashma flare up. No fever.  Some tightness in chest , no pain.  Some SOB, pt has hx of PE, states it doesn't feel like that. Pulse went up after using Albuterol. Onset of complaint: yesterday.  Pain score: denies Vitals:   05/13/23 1655 05/13/23 1657  BP:  (!) 142/78  Pulse:  (!) 128  Resp:  20  Temp:  98.8 F (37.1 C)  SpO2: 100% 100%     FHT:155 Lab orders placed from triage:

## 2023-05-13 NOTE — MAU Provider Note (Addendum)
History     CSN: [id number]  Arrival date and time: 05/13/23 1608   Event Date/Time   First Provider Initiated Contact with Patient 05/13/23 1705      Chief Complaint  Patient presents with   asthma flare   Cough   Sore throat started on Saturday with runny nose. Cough started yesterday, productive in nature, not able to say color or characteristics. SOB and exertion intolerance today. Has taken PRN albuterol MDI with short-lived improvement. No fevers, chills, nausea, vomiting, diarrhea, constipation, abdominal pain.   No VB, No LOF, No contractions, FM +.     OB History     Gravida  2   Para      Term      Preterm      AB  1   Living         SAB  1   IAB      Ectopic      Multiple      Live Births              Past Medical History:  Diagnosis Date   Asthma    has not had symptoms in years per pt on 12/04/21   Asthma 10/01/2018   COVID 09/2020   no antivirals or infusions, mild symptoms   DVT (deep venous thrombosis) (HCC) 2020   Patient had DVT in her popliteal vein after driving 12 hours to her sister's funeral. Patient had a thrombophilia evaluation that was negative.   Dysrhythmia    PVC's   Hypertension    Insulin resistance    Missed abortion    Palpitations 10/2018   LOV w/ cardiology, 08/14/20 Dr. Rosemary Holms in Epic. 01/25/21 Echo in Epic, EF 61%.   PCOS (polycystic ovarian syndrome)    PE (pulmonary thromboembolism) (HCC) 2020   bilateral   Plantar fasciitis 06/13/2021   left foot   Polycystic ovarian syndrome 10/01/2018    Past Surgical History:  Procedure Laterality Date   DILATION AND EVACUATION N/A 12/05/2021   Procedure: DILATATION AND EVACUATION;  Surgeon: Jerene Bears, MD;  Location: [facility];  Service: Gynecology;  Laterality: N/A;   WISDOM TOOTH EXTRACTION     around 2005    Family History  Adopted: Yes  Problem Relation Age of Onset   Hypertension Mother    Atrial fibrillation Father     Pulmonary embolism Sister    Diabetes Maternal Aunt    Asthma Neg Hx    Cancer Neg Hx    Heart disease Neg Hx     Social History   Tobacco Use   Smoking status: Never   Smokeless tobacco: Never  Vaping Use   Vaping status: Never Used  Substance Use Topics   Alcohol use: Not Currently    Comment: very rarely   Drug use: No    Allergies: No Known Allergies  Medications Prior to Admission  Medication Sig Dispense Refill Last Dose   aspirin EC 81 MG tablet Take 1 tablet (81 mg total) by mouth daily. Swallow whole.      Blood Glucose Monitoring Suppl (FREESTYLE LITE) w/Device KIT Dispense 1 device to check fasting blood sugars and 2hr pp blood sugars 1 kit 0    enoxaparin (LOVENOX) 60 MG/0.6ML injection Inject 0.6 mLs (60 mg total) into the skin every 12 (twelve) hours. 36 mL 3    glucose blood (FREESTYLE LITE) test strip Use as instructed 100 each 12    Lancets (FREESTYLE) lancets Use  as instructed 100 each 12    Prenatal Vit-Fe Fumarate-FA (MULTIVITAMIN-PRENATAL) 27-0.8 MG TABS tablet Take 1 tablet by mouth daily at 12 noon.      valACYclovir (VALTREX) 1000 MG tablet as needed. (Patient not taking: Reported on 02/07/2023)       Review of Systems  Constitutional:  Negative for chills and fever.  HENT:  Positive for rhinorrhea. Negative for congestion and sore throat.   Eyes:  Negative for pain and visual disturbance.  Respiratory:  Positive for cough, chest tightness, shortness of breath and wheezing. Negative for stridor.   Cardiovascular:  Negative for chest pain, palpitations and leg swelling.  Gastrointestinal:  Negative for abdominal pain, diarrhea, nausea and vomiting.  Endocrine: Negative for cold intolerance and heat intolerance.  Genitourinary:  Negative for dysuria and flank pain.  Musculoskeletal:  Negative for back pain.  Skin:  Negative for rash.  Allergic/Immunologic: Negative for food allergies.  Neurological:  Negative for dizziness and light-headedness.   Psychiatric/Behavioral:  Negative for agitation.   All other systems reviewed and are negative.  Physical Exam   Blood pressure (!) 142/78, pulse (!) 128, temperature 98.8 F (37.1 C), temperature source Oral, resp. rate 20, height 5\' 11"  (1.803 m), weight 115.2 kg, last menstrual period 11/23/2022, SpO2 100%, unknown if currently breastfeeding.  Physical Exam Vitals and nursing note reviewed.  Constitutional:      General: She is not in acute distress.    Appearance: Normal appearance.  HENT:     Right Ear: External ear normal.     Left Ear: External ear normal.     Nose: Nose normal.     Mouth/Throat:     Mouth: Mucous membranes are moist.  Eyes:     Conjunctiva/sclera: Conjunctivae normal.  Cardiovascular:     Rate and Rhythm: Regular rhythm. Tachycardia present.     Pulses: Normal pulses.     Heart sounds: Normal heart sounds.  Pulmonary:     Effort: Pulmonary effort is normal. No accessory muscle usage or respiratory distress.     Breath sounds: No decreased air movement. Examination of the right-upper field reveals wheezing. Examination of the left-upper field reveals wheezing. Examination of the right-middle field reveals wheezing. Examination of the left-middle field reveals wheezing. Examination of the right-lower field reveals wheezing. Examination of the left-lower field reveals wheezing. Wheezing present. No rhonchi or rales.  Abdominal:     General: Bowel sounds are normal.     Tenderness: There is no abdominal tenderness.  Musculoskeletal:        General: Normal range of motion.     Cervical back: Normal range of motion.     Right lower leg: 1+ Pitting Edema present.     Left lower leg: 1+ Pitting Edema present.     Comments: L calf circumference = R calf circumference  Skin:    Capillary Refill: Capillary refill takes less than 2 seconds.  Neurological:     General: No focal deficit present.     Mental Status: She is alert and oriented to person, place, and  time.  Psychiatric:        Mood and Affect: Mood normal.        Behavior: Behavior normal.     Fetal Assessment FHR 155  MAU Course   MDM - Moderate  Orders Placed This Encounter  Procedures   Resp panel by RT-PCR (RSV, Flu A&B, Covid) Anterior Nasal Swab    Standing Status:   Standing    Number of  Occurrences:   1   Airborne and Contact precautions    Standing Status:   Standing    Number of Occurrences:   1    ipratropium  0.5 mg Nebulization Q4H   And   albuterol  2.5 mg Nebulization Q4H   predniSONE  40 mg Oral Q breakfast    Results for orders placed or performed during the hospital encounter of 05/13/23 (from the past 24 hour(s))  Resp panel by RT-PCR (RSV, Flu A&B, Covid) Anterior Nasal Swab     Status: None   Collection Time: 05/13/23  5:48 PM   Specimen: Anterior Nasal Swab  Result Value Ref Range   SARS Coronavirus 2 by RT PCR NEGATIVE NEGATIVE   Influenza A by PCR NEGATIVE NEGATIVE   Influenza B by PCR NEGATIVE NEGATIVE   Resp Syncytial Virus by PCR NEGATIVE NEGATIVE    1745 - Assessed patient, noted to have diffuse wheezing bilaterally on posterior lung fields. Discussed concern for PE given tachycardia and DOE, however absence of fevers, no evidence of VTE, makes PE/VTE less likely. Also on Lovenox prophylaxis for h/o DVT/PE. Will treat with steroid burst, duo-neb and reassess after tx.   1832 - Reached out to RT for administration of medication. Unable to come to [facility] at this time. Cannot give ETA. States they will come as soon as possible.   1942 - In to apologize to patient for delay in care. She is understanding about delay. She continues hemodynamically stable and with audible wheezing, without dyspnea at rest. Tried to reach out to RT without response. Will continue to await medication administration.  8:09 PM  Dr. Nicki Reaper provided sign out to Dr. Lyndel Safe   8:15 PM assumed care from Dr Nicki Reaper   9:21 PM reassessed and ready for  discharge. Exam with minimal wheezing, faint at best   Assessment and Plan   32y G2P0010 F presenting for evaluation of cough and dyspnea on exertion. SIUP at [redacted]w[redacted]d FHR 155  1. Exacerbation of asthma, unspecified asthma severity, unspecified whether persistent   2. [redacted] weeks gestation of pregnancy   3. History of deep vein thrombosis (DVT) of lower extremity    DME order for nebulizer at home Symbicort BID rx today, will start after oral prednisone. Likely need to continue until December.  Oral prednisone x4 days (5d total, first dose received in MAU) -  -Pre-term labor precautions discussed, advised routine follow up with established OB on 05/15/23. She plans to keep this appointment. Return precautions for MAU given, including as deemed necessary.   Future Appointments  Date Time Provider Department Center  05/15/2023  8:15 AM Marton Redwood, Toma Aran, CNM [facility]  05/26/2023  2:30 PM [facility] US5 [facility]  06/03/2023  1:30 PM [facility] US1 [facility]  06/06/2023  8:35 AM Jerene Bears, MD [facility]      Allergies as of 05/13/2023   No Known Allergies      Medication List     TAKE these medications    aspirin EC 81 MG tablet Take 1 tablet (81 mg total) by mouth daily. Swallow whole.   budesonide-formoterol 80-4.5 MCG/ACT inhaler Commonly known as: SYMBICORT Inhale 2 puffs into the lungs 2 (two) times daily.   enoxaparin 60 MG/0.6ML injection Commonly known as: LOVENOX Inject 0.6 mLs (60 mg total) into the skin every 12 (twelve) hours.   freestyle lancets Use as instructed   FREESTYLE LITE test strip Generic drug: glucose blood Use as instructed  FreeStyle Lite w/Device Kit Dispense 1 device to check fasting blood sugars and 2hr pp blood sugars   ipratropium-albuterol 0.5-2.5 (3) MG/3ML Soln Commonly known as: DUONEB Take 3 mLs by nebulization every 6 (six) hours as needed (Wheezing, shortness of breath).   multivitamin-prenatal 27-0.8 MG Tabs  tablet Take 1 tablet by mouth daily at 12 noon.   predniSONE 20 MG tablet Commonly known as: DELTASONE Take 2 tablets (40 mg total) by mouth daily with breakfast. Start taking on: May 14, 2023   valACYclovir 1000 MG tablet Commonly known as: VALTREX as needed.               Durable Medical Equipment  (From admission, onward)           Start     Ordered   05/13/23 0000  For home use only DME Nebulizer machine       Question Answer Comment  Patient needs a nebulizer to treat with the following condition Asthma   Length of Need 12 Months      05/13/23 2111

## 2023-05-15 ENCOUNTER — Telehealth (HOSPITAL_BASED_OUTPATIENT_CLINIC_OR_DEPARTMENT_OTHER): Payer: Self-pay | Admitting: Certified Nurse Midwife

## 2023-05-15 ENCOUNTER — Encounter (HOSPITAL_BASED_OUTPATIENT_CLINIC_OR_DEPARTMENT_OTHER): Payer: 59 | Admitting: Certified Nurse Midwife

## 2023-05-15 DIAGNOSIS — O365920 Maternal care for other known or suspected poor fetal growth, second trimester, not applicable or unspecified: Secondary | ICD-10-CM | POA: Insufficient documentation

## 2023-05-15 DIAGNOSIS — O365912 Maternal care for other known or suspected poor fetal growth, first trimester, fetus 2: Secondary | ICD-10-CM | POA: Insufficient documentation

## 2023-05-15 NOTE — Telephone Encounter (Signed)
CNM called patient. She thought her ROB appt was tomorrow, did not realize it was today. We rescheduled her for Monday 05/19/23. Patient was tearful and upset she states r/t being concerned about the baby's [place] on 05/12/23 (3 days ago) showing some possible growth restriction. Pt states she is eating well, good appetite, has gained weight, staying hydrated. She agrees to call us with any questions, concerns or complaints. Toma Aran Consuela Widener  [place] (05/12/23) [redacted]w[redacted]d: Breech, posterior placenta, AFV 8%, EFW 1lb 4oz (8%), HC 1.3%, AC 4.7%, FL 39%. Anatomy incomplete (RVOT, 3VT, 4CH). UA dopplers showed normal forward diastolic flow. Repeat UA doppler in 2 weeks (11/11). Repeat US for Growth in 3 weeks (11/19).

## 2023-05-15 NOTE — Progress Notes (Deleted)
  [place] (05/12/23) [redacted]w[redacted]d: Breech, posterior placenta, AFV 8%, EFW 1lb 4oz (8%), HC 1.3%, AC 4.7%, FL 39%. Anatomy incomplete (RVOT, 3VT, 4CH). UA dopplers showed normal forward diastolic flow. Repeat UA doppler in 2 weeks (11/11). Repeat US for Growth in 3 weeks (11/19).

## 2023-05-19 ENCOUNTER — Encounter (HOSPITAL_BASED_OUTPATIENT_CLINIC_OR_DEPARTMENT_OTHER): Payer: Self-pay | Admitting: Certified Nurse Midwife

## 2023-05-19 ENCOUNTER — Ambulatory Visit (HOSPITAL_BASED_OUTPATIENT_CLINIC_OR_DEPARTMENT_OTHER): Payer: 59 | Admitting: Certified Nurse Midwife

## 2023-05-19 VITALS — BP 138/82 | HR 82 | Wt 252.0 lb

## 2023-05-19 DIAGNOSIS — O99210 Obesity complicating pregnancy, unspecified trimester: Secondary | ICD-10-CM

## 2023-05-19 DIAGNOSIS — Z3A25 25 weeks gestation of pregnancy: Secondary | ICD-10-CM

## 2023-05-19 DIAGNOSIS — O0990 Supervision of high risk pregnancy, unspecified, unspecified trimester: Secondary | ICD-10-CM

## 2023-05-19 DIAGNOSIS — I10 Essential (primary) hypertension: Secondary | ICD-10-CM

## 2023-05-19 DIAGNOSIS — R768 Other specified abnormal immunological findings in serum: Secondary | ICD-10-CM

## 2023-05-19 DIAGNOSIS — Z86711 Personal history of pulmonary embolism: Secondary | ICD-10-CM

## 2023-05-19 DIAGNOSIS — O365920 Maternal care for other known or suspected poor fetal growth, second trimester, not applicable or unspecified: Secondary | ICD-10-CM

## 2023-05-19 DIAGNOSIS — J45901 Unspecified asthma with (acute) exacerbation: Secondary | ICD-10-CM

## 2023-05-19 DIAGNOSIS — Z658 Other specified problems related to psychosocial circumstances: Secondary | ICD-10-CM

## 2023-05-19 DIAGNOSIS — Z86718 Personal history of other venous thrombosis and embolism: Secondary | ICD-10-CM

## 2023-05-19 NOTE — Progress Notes (Signed)
Accompanied by her supportive Chartered loss adjuster". He is from [place]. His Mother was a Immunologist in [place]. She is 48 and lives nearby, very supportive of this pregnancy but there is a language barrier as she doesn't speak [place].  Citlalli lives with Junior, 1 cat, no dogs. 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  Pt states she is doing much better emotionally. Was having some issues when I met her last but she states that issue has resolved. She is not on medication and doesn't feel like she needs counseling. She seems much happier today, smiling, bonded with baby.  Pt DOES have a history of Essential HTN, BP 138/832 today. No medications.   [place] 05/12/23 at 24w2:  Breech, posterior placenta, AFI 10cm (8%), EFW 574gm (8%) overall, HC 1.3%, AC 4.7%, FL 39%. Anatomy incomplete. Normal UAD.  Repeat UA dopplers 2 weeks (05/26/23) and repeat growth assessment 3 weeks (06/03/23).      PRENATAL VISIT NOTE  Subjective:  Marissa Hansen is a 32 y.o. G2P0010 at [redacted]w[redacted]d being seen today for ongoing prenatal care.  She is currently monitored for the following issues for this high-risk pregnancy and has Asthma exacerbation; History of deep vein thrombosis (DVT) of lower extremity; HSV-2 seropositive; Personal history of PE (pulmonary embolism); Supervision of high risk pregnancy, antepartum; Obesity affecting pregnancy, antepartum; Abnormal fetal ultrasound; Psychosocial stressors; History of miscarriage; [redacted] weeks gestation of pregnancy; Encounter for maternal care for suspected poor fetal growth in singleton pregnancy in second trimester; and Asthma with acute exacerbation on their problem list.  Patient reports no bleeding, no contractions, no cramping, and no leaking.  Contractions: Not present. Vag. Bleeding: None.  Movement: Present. Denies leaking of fluid.   The following portions of the patient's history were reviewed and updated as  appropriate: allergies, current medications, past family history, past medical history, past social history, past surgical history and problem list.   Objective:   Vitals:   05/19/23 0912  BP: 138/82  Pulse: 82  Weight: 252 lb (114.3 kg)    Fetal Status: Fetal Heart Rate (bpm): 140   Movement: Present  Presentation: Complete Breech  General:  Alert, oriented and cooperative. Patient is in no acute distress.  Skin: Skin is warm and dry. No rash noted.   Cardiovascular: Normal heart rate noted  Respiratory: Respiratory rate normal  Abdomen: Soft, gravid, appropriate for gestational age.  Pain/Pressure: Absent (Pressure maybe gas build up)     Pelvic: Cervical exam deferred        Extremities: Normal range of motion.  Edema: None  Mental Status: Normal mood and affect. Normal behavior. Normal judgment and thought content.   1. Supervision of high risk pregnancy, antepartum - Flu Vaccine UTD  2. Psychosocial stressors - Pt states stressor has resolved, doing better emotionally  3. History of deep vein thrombosis (DVT) of lower extremity - On Lovenox BID  4. HSV-2 seropositive - Plan Valtrex at 36 weeks  5. Personal history of PE (pulmonary embolism), on Lovenox BID   6. Obesity affecting pregnancy, antepartum, unspecified obesity type - 22lb weight gain in pregnancy  7. Essential hypertension - BP stable 138/82, no anti-hypertensives  8. [redacted] weeks gestation of pregnancy   9. Asthma with acute exacerbation, unspecified asthma severity, unspecified whether persistent - Pt has Albuterol inhaler prn, on daily steroid, has nebulizers. She is using nebulizers at least once daily.   10. Encounter for maternal  care for suspected poor fetal growth in singleton pregnancy in second trimester -  [place] 05/12/23 at 24w2:  Breech, posterior placenta, AFI 10cm (8%), EFW 574gm (8%) overall, HC 1.3%, AC 4.7%, FL 39%. Anatomy incomplete. Normal UAD.  Repeat UA dopplers 2 weeks (05/26/23) and  repeat growth assessment 3 weeks (06/03/23).   Preterm labor symptoms and general obstetric precautions including but not limited to vaginal bleeding, contractions, leaking of fluid and fetal movement were reviewed in detail with the patient. Please refer to After Visit Summary for other counseling recommendations.   No follow-ups on file.  Future Appointments  Date Time Provider Department Center  05/26/2023  2:30 PM [facility] US5 [facility]  06/03/2023  1:30 PM [facility] US1 [facility]  06/06/2023  8:35 AM Jerene Bears, MD [facility]    Letta Kocher, CNM

## 2023-05-19 NOTE — Patient Instructions (Addendum)
Third Trimester of Pregnancy In this video, you'll learn about the common changes that occur during the third trimester (last 3 months) of pregnancy. To view the content, go to this web address: [email or web address]  This video will expire on: 01/05/2025. If you need access to this video following this date, please reach out to the healthcare provider who assigned it to you. This information is not intended to replace advice given to you by your health care provider. Make sure you discuss any questions you have with your health care provider.  Problems to Watch for During Pregnancy This video reviews the signs and symptoms of some of the problems that may occur during pregnancy so you know what to watch for and when to contact your health care team.  To view the content, go to this web address: [email or web address]  This video will expire on: 01/05/2025. If you need access to this video following this date, please reach out to the healthcare provider who assigned it to you. This information is not intended to replace advice given to you by your health care provider. Make sure you discuss any questions you have with your health care provider. Elsevier Patient Education  2024 Elsevier Inc.  Breastfeeding Benefits In this video, you will more about breastfeeding and how it can benefit both you and your baby. To view the content, go to this web address: [email or web address]  This video will expire on: 01/05/2025. If you need access to this video following this date, please reach out to the healthcare provider who assigned it to you. This information is not intended to replace advice given to you by your health care provider. Make sure you discuss any questions you have with your health care provider. Elsevier Patient Education  2024 Elsevier Inc.   Breastfeeding Your Newborn Breastfeeding can play an important role in your early moments and days with your  new baby. This video will give you an overview of breastfeeding and provide helpful tips on how to get started. To view the content, go to this web address: [email or web address]  This video will expire on: 09/11/2024. If you need access to this video following this date, please reach out to the healthcare provider who assigned it to you. This information is not intended to replace advice given to you by your health care provider. Make sure you discuss any questions you have with your health care provider. Elsevier Patient Education  2024 Elsevier Inc.  Common Breastfeeding Positions This video show you some of the most common breastfeeding positions and how to do them. To view the content, go to this web address: [email or web address]  This video will expire on: 01/05/2025. If you need access to this video following this date, please reach out to the healthcare provider who assigned it to you. This information is not intended to replace advice given to you by your health care provider. Make sure you discuss any questions you have with your health care provider. Elsevier Patient Education  2024 [employer].  Establishing Your Breastfeeding Routine During the first days and weeks after your baby is born, you'll work toward establishing a daily routine for breastfeeding, both at home and if you return to work. This video will give you some tips to help you find a breastfeeding routine that works for you and your baby. To view the content, go to this web address: [email or web address]  This video will expire on: 01/05/2025. If you need access to this video following this date, please reach out to the  healthcare provider who assigned it to you. This information is not intended to replace advice given to you by your health care provider. Make sure you discuss any questions you have with your health care provider. Elsevier Patient Education  2024 Elsevier  Inc.  Latching and Removal for Breastfeeding Breastfeeding can play an important role in your early moments and days with your new baby. This video will help you learn more about latching and removal, which are two key parts of breastfeeding. To view the content, go to this web address: [email or web address]  This video will expire on: 01/05/2025. If you need access to this video following this date, please reach out to the healthcare provider who assigned it to you. This information is not intended to replace advice given to you by your health care provider. Make sure you discuss any questions you have with your health care provider. Elsevier Patient Education  2024 Elsevier Inc.   Overcoming Common Breastfeeding Challenges Breastfeeding has a number of great benefits, but also may have some challenges. This video will teach you how to overcome common breastfeeding challenges. To view the content, go to this web address: [email or web address]  This video will expire on: 01/05/2025. If you need access to this video following this date, please reach out to the healthcare provider who assigned it to you. This information is not intended to replace advice given to you by your health care provider. Make sure you discuss any questions you have with your health care provider. Elsevier Patient Education  2024 [employer].

## 2023-05-26 ENCOUNTER — Ambulatory Visit: Payer: 59 | Attending: Obstetrics and Gynecology

## 2023-05-26 ENCOUNTER — Other Ambulatory Visit: Payer: Self-pay

## 2023-05-26 VITALS — BP 121/71

## 2023-05-26 DIAGNOSIS — Z3A26 26 weeks gestation of pregnancy: Secondary | ICD-10-CM

## 2023-05-26 DIAGNOSIS — Z8679 Personal history of other diseases of the circulatory system: Secondary | ICD-10-CM | POA: Insufficient documentation

## 2023-05-26 DIAGNOSIS — O365920 Maternal care for other known or suspected poor fetal growth, second trimester, not applicable or unspecified: Secondary | ICD-10-CM | POA: Diagnosis present

## 2023-05-26 DIAGNOSIS — O0990 Supervision of high risk pregnancy, unspecified, unspecified trimester: Secondary | ICD-10-CM | POA: Diagnosis present

## 2023-05-26 DIAGNOSIS — E669 Obesity, unspecified: Secondary | ICD-10-CM | POA: Diagnosis not present

## 2023-05-26 DIAGNOSIS — Z362 Encounter for other antenatal screening follow-up: Secondary | ICD-10-CM | POA: Diagnosis present

## 2023-05-26 DIAGNOSIS — O99212 Obesity complicating pregnancy, second trimester: Secondary | ICD-10-CM | POA: Diagnosis present

## 2023-06-03 ENCOUNTER — Ambulatory Visit: Payer: 59 | Attending: Obstetrics and Gynecology

## 2023-06-03 ENCOUNTER — Other Ambulatory Visit: Payer: Self-pay

## 2023-06-03 ENCOUNTER — Other Ambulatory Visit: Payer: Self-pay | Admitting: *Deleted

## 2023-06-03 DIAGNOSIS — Z8679 Personal history of other diseases of the circulatory system: Secondary | ICD-10-CM | POA: Insufficient documentation

## 2023-06-03 DIAGNOSIS — O365920 Maternal care for other known or suspected poor fetal growth, second trimester, not applicable or unspecified: Secondary | ICD-10-CM | POA: Diagnosis present

## 2023-06-03 DIAGNOSIS — Z362 Encounter for other antenatal screening follow-up: Secondary | ICD-10-CM | POA: Diagnosis present

## 2023-06-03 DIAGNOSIS — Z3A27 27 weeks gestation of pregnancy: Secondary | ICD-10-CM

## 2023-06-03 DIAGNOSIS — O358XX0 Maternal care for other (suspected) fetal abnormality and damage, not applicable or unspecified: Secondary | ICD-10-CM | POA: Diagnosis not present

## 2023-06-03 DIAGNOSIS — O99212 Obesity complicating pregnancy, second trimester: Secondary | ICD-10-CM | POA: Diagnosis present

## 2023-06-04 DIAGNOSIS — O365990 Maternal care for other known or suspected poor fetal growth, unspecified trimester, not applicable or unspecified: Secondary | ICD-10-CM | POA: Insufficient documentation

## 2023-06-06 ENCOUNTER — Encounter (HOSPITAL_BASED_OUTPATIENT_CLINIC_OR_DEPARTMENT_OTHER): Payer: Self-pay | Admitting: Certified Nurse Midwife

## 2023-06-06 ENCOUNTER — Ambulatory Visit (HOSPITAL_BASED_OUTPATIENT_CLINIC_OR_DEPARTMENT_OTHER): Payer: 59 | Admitting: Certified Nurse Midwife

## 2023-06-06 VITALS — BP 128/79 | HR 87 | Wt 262.0 lb

## 2023-06-06 DIAGNOSIS — O0990 Supervision of high risk pregnancy, unspecified, unspecified trimester: Secondary | ICD-10-CM

## 2023-06-06 DIAGNOSIS — Z3A27 27 weeks gestation of pregnancy: Secondary | ICD-10-CM | POA: Diagnosis not present

## 2023-06-06 DIAGNOSIS — Z86718 Personal history of other venous thrombosis and embolism: Secondary | ICD-10-CM

## 2023-06-06 DIAGNOSIS — Z658 Other specified problems related to psychosocial circumstances: Secondary | ICD-10-CM

## 2023-06-06 DIAGNOSIS — Z23 Encounter for immunization: Secondary | ICD-10-CM | POA: Diagnosis not present

## 2023-06-06 DIAGNOSIS — R768 Other specified abnormal immunological findings in serum: Secondary | ICD-10-CM

## 2023-06-06 DIAGNOSIS — Z86711 Personal history of pulmonary embolism: Secondary | ICD-10-CM

## 2023-06-06 DIAGNOSIS — O365930 Maternal care for other known or suspected poor fetal growth, third trimester, not applicable or unspecified: Secondary | ICD-10-CM

## 2023-06-06 MED ORDER — ENOXAPARIN SODIUM 60 MG/0.6ML IJ SOSY
60.0000 mg | PREFILLED_SYRINGE | Freq: Two times a day (BID) | INTRAMUSCULAR | 3 refills | Status: DC
Start: 2023-06-06 — End: 2023-07-18

## 2023-06-06 NOTE — Progress Notes (Signed)
PRENATAL VISIT NOTE  Subjective:  Marissa Hansen is a 32 y.o. G2P0010 at [redacted]w[redacted]d being seen today for ongoing prenatal care.  She is currently monitored for the following issues for this high-risk pregnancy and has Asthma exacerbation; History of deep vein thrombosis (DVT) of lower extremity, On Lovenox BID; HSV-2 seropositive; Start Valtrex at 36 weeks; Personal history of PE (pulmonary embolism); Supervision of high risk pregnancy, antepartum; Obesity affecting pregnancy, antepartum; Abnormal fetal ultrasound; Psychosocial stressors-resolved; History of miscarriage; [redacted] weeks gestation of pregnancy; Encounter for maternal care for suspected poor fetal growth in singleton pregnancy in second trimester; Asthma with acute exacerbation; and IUGR (intrauterine growth restriction) affecting care of mother on their problem list.  Patient reports no bleeding, no contractions, no cramping, and no leaking.  Contractions: Not present. Vag. Bleeding: None.  Movement: Present. Denies leaking of fluid.   The following portions of the patient's history were reviewed and updated as appropriate: allergies, current medications, past family history, past medical history, past social history, past surgical history and problem list.   Objective:   Vitals:   06/06/23 0824  BP: 128/79  Pulse: 87  Weight: 262 lb (118.8 kg)    Fetal Status:     Movement: Present     General:  Alert, oriented and cooperative. Patient is in no acute distress.  Skin: Skin is warm and dry. No rash noted.   Cardiovascular: Normal heart rate noted  Respiratory: Normal respiratory effort, no problems with respiration noted  Abdomen: Soft, gravid, appropriate for gestational age.  Pain/Pressure: Absent     Pelvic: Cervical exam deferred        Extremities: Normal range of motion.  Edema: None  Mental Status: Normal mood and affect. Normal behavior. Normal judgment and thought content.   Assessment and Plan:  Pregnancy:  G2P0010 at [redacted]w[redacted]d 1. Supervision of high risk pregnancy, antepartum - [place] (06/03/23): Breech, posterior placenta, AFI WNL, EFW 1lb 14oz (3%), HC 3%, AC 3%, FL 11%. Normal UAD. Plan: Continue UAD every 1-2 weeks and NST until 32w. Serial Growth every 3 weeks until delivery. Delivery pending clinical course. - Glucose Tolerance, 2 Hours w/1 Hour - CBC - HIV Antibody (routine testing w rflx) - RPR  2. Poor fetal growth affecting management of mother in third trimester, single or unspecified fetus -  [place] (06/03/23): Breech, posterior placenta, AFI WNL, EFW 1lb 14oz (3%), HC 3%, AC 3%, FL 11%. Normal UAD. Plan: Continue UAD every 1-2 weeks and NST until 32w. Serial Growth every 3 weeks until delivery. Delivery pending clinical course.  3. [redacted] weeks gestation of pregnancy - Tdap vaccine greater than or equal to 7yo IM - 2hr GTT/labs today - Rhogam not indicated (B+)  4. Psychosocial stressors-resolved   5. History of deep vein thrombosis (DVT) of lower extremity, On Lovenox BID - continue Lovenox 60mg  SQ BID   6. HSV-2 seropositive - Plan Valtrex 500mg  po BID at 36 weeks   Preterm labor symptoms and general obstetric precautions including but not limited to vaginal bleeding, contractions, leaking of fluid and fetal movement were reviewed in detail with the patient. Please refer to After Visit Summary for other counseling recommendations.   No follow-ups on file.  Future Appointments  Date Time Provider Department Center  06/09/2023  9:30 AM [facility] NURSE [facility]  06/09/2023  9:45 AM [facility] NST [facility]  06/16/2023  2:00 PM [facility] NURSE [facility]  06/16/2023  2:15 PM [facility] NST [facility]  06/16/2023  3:30 PM [facility] US5 [facility]  06/24/2023 11:15 AM [facility] NURSE [facility]  06/24/2023 11:30 AM [facility] US6 [facility]  06/24/2023  1:15 PM [facility] NST [facility]  07/01/2023  9:35 AM Jerene Bears, MD [facility]  07/18/2023  9:15 AM Jerene Bears, MD [facility]   08/01/2023  9:15 AM Shatora Weatherbee, Toma Aran, CNM [facility]    Letta Kocher, CNM

## 2023-06-07 LAB — CBC
Hematocrit: 32.1 % — ABNORMAL LOW (ref 34.0–46.6)
Hemoglobin: 10 g/dL — ABNORMAL LOW (ref 11.1–15.9)
MCH: 25.4 pg — ABNORMAL LOW (ref 26.6–33.0)
MCHC: 31.2 g/dL — ABNORMAL LOW (ref 31.5–35.7)
MCV: 82 fL (ref 79–97)
Platelets: 361 10*3/uL (ref 150–450)
RBC: 3.94 x10E6/uL (ref 3.77–5.28)
RDW: 15 % (ref 11.7–15.4)
WBC: 11.7 10*3/uL — ABNORMAL HIGH (ref 3.4–10.8)

## 2023-06-07 LAB — RPR: RPR Ser Ql: NONREACTIVE

## 2023-06-07 LAB — HIV ANTIBODY (ROUTINE TESTING W REFLEX): HIV Screen 4th Generation wRfx: NONREACTIVE

## 2023-06-07 LAB — GLUCOSE TOLERANCE, 2 HOURS W/ 1HR
Glucose, 1 hour: 120 mg/dL (ref 70–179)
Glucose, 2 hour: 114 mg/dL (ref 70–152)
Glucose, Fasting: 82 mg/dL (ref 70–91)

## 2023-06-09 ENCOUNTER — Ambulatory Visit: Payer: 59 | Admitting: *Deleted

## 2023-06-09 ENCOUNTER — Ambulatory Visit: Payer: 59 | Attending: Maternal & Fetal Medicine | Admitting: *Deleted

## 2023-06-09 ENCOUNTER — Other Ambulatory Visit: Payer: Self-pay

## 2023-06-09 VITALS — BP 138/68 | HR 71

## 2023-06-09 DIAGNOSIS — O26893 Other specified pregnancy related conditions, third trimester: Secondary | ICD-10-CM | POA: Diagnosis not present

## 2023-06-09 DIAGNOSIS — O365920 Maternal care for other known or suspected poor fetal growth, second trimester, not applicable or unspecified: Secondary | ICD-10-CM

## 2023-06-09 DIAGNOSIS — O0990 Supervision of high risk pregnancy, unspecified, unspecified trimester: Secondary | ICD-10-CM

## 2023-06-09 DIAGNOSIS — Z86718 Personal history of other venous thrombosis and embolism: Secondary | ICD-10-CM | POA: Insufficient documentation

## 2023-06-09 DIAGNOSIS — O365990 Maternal care for other known or suspected poor fetal growth, unspecified trimester, not applicable or unspecified: Secondary | ICD-10-CM | POA: Diagnosis not present

## 2023-06-09 DIAGNOSIS — O365930 Maternal care for other known or suspected poor fetal growth, third trimester, not applicable or unspecified: Secondary | ICD-10-CM

## 2023-06-09 DIAGNOSIS — Z8759 Personal history of other complications of pregnancy, childbirth and the puerperium: Secondary | ICD-10-CM | POA: Insufficient documentation

## 2023-06-09 DIAGNOSIS — Z3A28 28 weeks gestation of pregnancy: Secondary | ICD-10-CM | POA: Diagnosis not present

## 2023-06-09 NOTE — Procedures (Signed)
COURAGE MOWER 06-Aug-1990 [redacted]w[redacted]d  Fetus A Non-Stress Test Interpretation for 06/09/23  Indication: IUGR and Hx: DVT on Lovenox  Fetal Heart Rate A Mode: External Baseline Rate (A): 150 bpm Variability: Moderate Accelerations: 10 x 10 Decelerations: None Multiple birth?: No  Uterine Activity Mode: Toco Contraction Frequency (min): None Resting Tone Palpated: Relaxed  Interpretation (Fetal Testing) Nonstress Test Interpretation: Reactive Comments: Tracing reviewed by Dr. Parke Poisson

## 2023-06-16 ENCOUNTER — Ambulatory Visit: Payer: 59 | Admitting: *Deleted

## 2023-06-16 ENCOUNTER — Encounter: Payer: Self-pay | Admitting: *Deleted

## 2023-06-16 ENCOUNTER — Ambulatory Visit: Payer: 59 | Attending: Maternal & Fetal Medicine

## 2023-06-16 ENCOUNTER — Ambulatory Visit: Payer: 59

## 2023-06-16 VITALS — BP 125/64 | HR 79

## 2023-06-16 DIAGNOSIS — O365930 Maternal care for other known or suspected poor fetal growth, third trimester, not applicable or unspecified: Secondary | ICD-10-CM | POA: Diagnosis present

## 2023-06-16 DIAGNOSIS — O365920 Maternal care for other known or suspected poor fetal growth, second trimester, not applicable or unspecified: Secondary | ICD-10-CM | POA: Insufficient documentation

## 2023-06-16 DIAGNOSIS — O0990 Supervision of high risk pregnancy, unspecified, unspecified trimester: Secondary | ICD-10-CM | POA: Diagnosis present

## 2023-06-16 DIAGNOSIS — O99213 Obesity complicating pregnancy, third trimester: Secondary | ICD-10-CM | POA: Diagnosis not present

## 2023-06-16 DIAGNOSIS — O99212 Obesity complicating pregnancy, second trimester: Secondary | ICD-10-CM | POA: Insufficient documentation

## 2023-06-16 DIAGNOSIS — Z3A29 29 weeks gestation of pregnancy: Secondary | ICD-10-CM | POA: Insufficient documentation

## 2023-06-16 DIAGNOSIS — E669 Obesity, unspecified: Secondary | ICD-10-CM | POA: Diagnosis not present

## 2023-06-16 NOTE — Procedures (Signed)
CORRY FULWILER Nov 23, 1990 [redacted]w[redacted]d  Fetus A Non-Stress Test Interpretation for 06/16/23   NST only  Indication: IUGR  Fetal Heart Rate A Mode: External Baseline Rate (A): 140 bpm Variability: Moderate Accelerations: 15 x 15 Decelerations: None Multiple birth?: No  Uterine Activity Mode: Palpation, Toco Contraction Frequency (min): none Resting Tone Palpated: Relaxed  Interpretation (Fetal Testing) Nonstress Test Interpretation: Reactive Overall Impression: Reassuring for gestational age Comments: Dr. Darra Lis reviewed tracing

## 2023-06-17 ENCOUNTER — Other Ambulatory Visit: Payer: Self-pay | Admitting: *Deleted

## 2023-06-17 DIAGNOSIS — O365990 Maternal care for other known or suspected poor fetal growth, unspecified trimester, not applicable or unspecified: Secondary | ICD-10-CM

## 2023-06-24 ENCOUNTER — Ambulatory Visit: Payer: 59 | Attending: Maternal & Fetal Medicine

## 2023-06-24 ENCOUNTER — Ambulatory Visit: Payer: 59 | Admitting: *Deleted

## 2023-06-24 VITALS — BP 118/70 | HR 88

## 2023-06-24 DIAGNOSIS — O365930 Maternal care for other known or suspected poor fetal growth, third trimester, not applicable or unspecified: Secondary | ICD-10-CM | POA: Insufficient documentation

## 2023-06-24 DIAGNOSIS — O0990 Supervision of high risk pregnancy, unspecified, unspecified trimester: Secondary | ICD-10-CM | POA: Diagnosis present

## 2023-06-24 DIAGNOSIS — O365920 Maternal care for other known or suspected poor fetal growth, second trimester, not applicable or unspecified: Secondary | ICD-10-CM | POA: Diagnosis present

## 2023-06-24 DIAGNOSIS — Z3A30 30 weeks gestation of pregnancy: Secondary | ICD-10-CM

## 2023-06-24 DIAGNOSIS — O99213 Obesity complicating pregnancy, third trimester: Secondary | ICD-10-CM | POA: Diagnosis not present

## 2023-06-24 DIAGNOSIS — E669 Obesity, unspecified: Secondary | ICD-10-CM | POA: Diagnosis not present

## 2023-06-24 DIAGNOSIS — O99212 Obesity complicating pregnancy, second trimester: Secondary | ICD-10-CM | POA: Insufficient documentation

## 2023-06-24 NOTE — Procedures (Signed)
Marissa Hansen 1990-08-30 100w3d  Fetus A Non-Stress Test Interpretation for 06/24/23  Indication: IUGR  Fetal Heart Rate A Mode: External Baseline Rate (A): 140 bpm Variability: Moderate Accelerations: 15 x 15 Decelerations: None Multiple birth?: No  Uterine Activity Mode: Toco Contraction Frequency (min): none Resting Tone Palpated: Relaxed  Interpretation (Fetal Testing) Nonstress Test Interpretation: Reactive Comments: Tracing reviewed byDr. Grace Bushy

## 2023-06-24 NOTE — Procedures (Signed)
Marissa Hansen 1990-07-19 [redacted]w[redacted]d  Fetus A Non-Stress Test Interpretation for 06/24/23  Indication: IUGR- NST with BPP, unsatisfactory BPP  Fetal Heart Rate A Mode: External Baseline Rate (A): 140 bpm Variability: Moderate Accelerations: 15 x 15 Decelerations: None Multiple birth?: No  Uterine Activity Mode: Toco Contraction Frequency (min): none Resting Tone Palpated: Relaxed  Interpretation (Fetal Testing) Nonstress Test Interpretation: Reactive Comments: Tracing reviewed byDr. Grace Bushy

## 2023-07-01 ENCOUNTER — Ambulatory Visit (HOSPITAL_BASED_OUTPATIENT_CLINIC_OR_DEPARTMENT_OTHER): Payer: 59 | Admitting: Obstetrics & Gynecology

## 2023-07-01 VITALS — BP 120/74 | HR 77 | Wt 272.2 lb

## 2023-07-01 DIAGNOSIS — O365930 Maternal care for other known or suspected poor fetal growth, third trimester, not applicable or unspecified: Secondary | ICD-10-CM

## 2023-07-01 DIAGNOSIS — Z86718 Personal history of other venous thrombosis and embolism: Secondary | ICD-10-CM

## 2023-07-01 DIAGNOSIS — Z3A31 31 weeks gestation of pregnancy: Secondary | ICD-10-CM

## 2023-07-01 DIAGNOSIS — O99013 Anemia complicating pregnancy, third trimester: Secondary | ICD-10-CM

## 2023-07-01 DIAGNOSIS — R768 Other specified abnormal immunological findings in serum: Secondary | ICD-10-CM

## 2023-07-01 DIAGNOSIS — Z86711 Personal history of pulmonary embolism: Secondary | ICD-10-CM

## 2023-07-01 DIAGNOSIS — Z6837 Body mass index (BMI) 37.0-37.9, adult: Secondary | ICD-10-CM

## 2023-07-01 DIAGNOSIS — O0990 Supervision of high risk pregnancy, unspecified, unspecified trimester: Secondary | ICD-10-CM

## 2023-07-04 ENCOUNTER — Other Ambulatory Visit: Payer: Self-pay | Admitting: Maternal & Fetal Medicine

## 2023-07-04 ENCOUNTER — Other Ambulatory Visit: Payer: Self-pay | Admitting: Obstetrics and Gynecology

## 2023-07-04 ENCOUNTER — Ambulatory Visit: Payer: 59 | Admitting: *Deleted

## 2023-07-04 ENCOUNTER — Other Ambulatory Visit: Payer: Self-pay | Admitting: *Deleted

## 2023-07-04 ENCOUNTER — Ambulatory Visit: Payer: 59 | Attending: Maternal & Fetal Medicine

## 2023-07-04 VITALS — BP 122/61 | HR 78

## 2023-07-04 DIAGNOSIS — Z362 Encounter for other antenatal screening follow-up: Secondary | ICD-10-CM

## 2023-07-04 DIAGNOSIS — Z8679 Personal history of other diseases of the circulatory system: Secondary | ICD-10-CM | POA: Diagnosis present

## 2023-07-04 DIAGNOSIS — O365990 Maternal care for other known or suspected poor fetal growth, unspecified trimester, not applicable or unspecified: Secondary | ICD-10-CM

## 2023-07-04 DIAGNOSIS — E669 Obesity, unspecified: Secondary | ICD-10-CM

## 2023-07-04 DIAGNOSIS — O365930 Maternal care for other known or suspected poor fetal growth, third trimester, not applicable or unspecified: Secondary | ICD-10-CM | POA: Diagnosis not present

## 2023-07-04 DIAGNOSIS — O365920 Maternal care for other known or suspected poor fetal growth, second trimester, not applicable or unspecified: Secondary | ICD-10-CM | POA: Insufficient documentation

## 2023-07-04 DIAGNOSIS — O09293 Supervision of pregnancy with other poor reproductive or obstetric history, third trimester: Secondary | ICD-10-CM | POA: Diagnosis not present

## 2023-07-04 DIAGNOSIS — O99213 Obesity complicating pregnancy, third trimester: Secondary | ICD-10-CM | POA: Diagnosis not present

## 2023-07-04 DIAGNOSIS — O0990 Supervision of high risk pregnancy, unspecified, unspecified trimester: Secondary | ICD-10-CM | POA: Diagnosis present

## 2023-07-04 DIAGNOSIS — Z3A31 31 weeks gestation of pregnancy: Secondary | ICD-10-CM

## 2023-07-04 DIAGNOSIS — O99212 Obesity complicating pregnancy, second trimester: Secondary | ICD-10-CM | POA: Diagnosis present

## 2023-07-04 DIAGNOSIS — O99013 Anemia complicating pregnancy, third trimester: Secondary | ICD-10-CM | POA: Insufficient documentation

## 2023-07-04 MED ORDER — FERROUS SULFATE 325 (65 FE) MG PO TABS: 325.0000 mg | ORAL_TABLET | Freq: Every day | ORAL | Status: DC

## 2023-07-04 NOTE — Progress Notes (Signed)
   PRENATAL VISIT NOTE  Subjective:  Marissa Hansen is a 32 y.o. G2P0010 at [redacted]w[redacted]d being seen today for ongoing prenatal care.  She is currently monitored for the following issues for this high-risk pregnancy and has Asthma exacerbation; History of deep vein thrombosis (DVT) of lower extremity, On Lovenox BID; HSV-2 seropositive; Start Valtrex at 36 weeks; Personal history of PE (pulmonary embolism); Supervision of high risk pregnancy, antepartum; Obesity affecting pregnancy, antepartum; Abnormal fetal ultrasound; Psychosocial stressors-resolved; History of miscarriage; Encounter for maternal care for suspected poor fetal growth in singleton pregnancy in second trimester; Asthma with acute exacerbation; IUGR (intrauterine growth restriction) affecting care of mother; and Anemia affecting pregnancy in third trimester on their problem list.  Patient reports no complaints.  Contractions: Not present. Vag. Bleeding: None.  Movement: Present. Denies leaking of fluid.   The following portions of the patient's history were reviewed and updated as appropriate: allergies, current medications, past family history, past medical history, past social history, past surgical history and problem list.   Objective:   Vitals:   07/01/23 1015  BP: 120/74  Pulse: 77  Weight: 272 lb 3.2 oz (123.5 kg)    Fetal Status: Fetal Heart Rate (bpm): 147   Movement: Present     General:  Alert, oriented and cooperative. Patient is in no acute distress.  Skin: Skin is warm and dry. No rash noted.   Cardiovascular: Normal heart rate noted  Respiratory: Normal respiratory effort, no problems with respiration noted  Abdomen: Soft, gravid, appropriate for gestational age.  Pain/Pressure: Absent     Pelvic: Cervical exam deferred        Extremities: Normal range of motion.  Edema: Mild pitting, slight indentation (left ankle)  Mental Status: Normal mood and affect. Normal behavior. Normal judgment and thought content.    Assessment and Plan:  Pregnancy: G2P0010 at [redacted]w[redacted]d 1. Supervision of high risk pregnancy, antepartum (Primary) - on PNV and baby ASA  2. [redacted] weeks gestation of pregnancy - recheck 2 weeks  3. Poor fetal growth affecting management of mother in third trimester, single or unspecified fetus - having q 3 week growth scans, weekly monitoring and cord dopplers  4. HSV-2 seropositive - discussed starting Valtrex around 36 weeks  5. History of deep vein thrombosis (DVT) of lower extremity, On Lovenox BID - on Lovenox 0.5mg /kg BID dosing.  6. Personal history of PE (pulmonary embolism)  7. BMI 37.0-37.9, adult  8. Anemia affecting pregnancy in third trimester -  on PNV and oral iron.  Will plan to recheck hb closer to delivery.    Preterm labor symptoms and general obstetric precautions including but not limited to vaginal bleeding, contractions, leaking of fluid and fetal movement were reviewed in detail with the patient. Please refer to After Visit Summary for other counseling recommendations.   Return in about 2 weeks (around 07/15/2023).  Future Appointments  Date Time Provider Department Center  07/11/2023  7:30 AM [facility] US2 [facility]  07/17/2023 10:15 AM [facility] NURSE [facility]  07/17/2023 10:30 AM [facility] US3 [facility]  07/18/2023  9:15 AM Jerene Bears, MD [facility]  08/01/2023  9:15 AM Lo, Toma Aran, CNM [facility]    Jerene Bears, MD

## 2023-07-06 ENCOUNTER — Other Ambulatory Visit: Payer: Self-pay

## 2023-07-06 ENCOUNTER — Inpatient Hospital Stay (HOSPITAL_COMMUNITY)
Admission: AD | Admit: 2023-07-06 | Discharge: 2023-07-06 | Disposition: A | Discharge status: Home / Self Care | Payer: 59 | Attending: Obstetrics and Gynecology | Admitting: Obstetrics and Gynecology

## 2023-07-06 ENCOUNTER — Ambulatory Visit (HOSPITAL_COMMUNITY)
Admission: RE | Admit: 2023-07-06 | Discharge: 2023-07-06 | Disposition: A | Discharge status: Home / Self Care | Payer: 59 | Source: Ambulatory Visit | Attending: Family Medicine | Admitting: Family Medicine

## 2023-07-06 DIAGNOSIS — M79661 Pain in right lower leg: Secondary | ICD-10-CM | POA: Diagnosis present

## 2023-07-06 DIAGNOSIS — M79662 Pain in left lower leg: Secondary | ICD-10-CM | POA: Insufficient documentation

## 2023-07-06 DIAGNOSIS — R609 Edema, unspecified: Secondary | ICD-10-CM | POA: Insufficient documentation

## 2023-07-06 DIAGNOSIS — Z3A32 32 weeks gestation of pregnancy: Secondary | ICD-10-CM | POA: Diagnosis not present

## 2023-07-06 DIAGNOSIS — Z86711 Personal history of pulmonary embolism: Secondary | ICD-10-CM | POA: Insufficient documentation

## 2023-07-06 DIAGNOSIS — O26893 Other specified pregnancy related conditions, third trimester: Secondary | ICD-10-CM | POA: Insufficient documentation

## 2023-07-06 DIAGNOSIS — R252 Cramp and spasm: Secondary | ICD-10-CM | POA: Insufficient documentation

## 2023-07-06 DIAGNOSIS — O99891 Other specified diseases and conditions complicating pregnancy: Secondary | ICD-10-CM | POA: Insufficient documentation

## 2023-07-06 DIAGNOSIS — Z3A39 39 weeks gestation of pregnancy: Secondary | ICD-10-CM

## 2023-07-06 DIAGNOSIS — Z86718 Personal history of other venous thrombosis and embolism: Secondary | ICD-10-CM | POA: Insufficient documentation

## 2023-07-06 DIAGNOSIS — Z7901 Long term (current) use of anticoagulants: Secondary | ICD-10-CM | POA: Insufficient documentation

## 2023-07-06 NOTE — MAU Note (Signed)
.  Marissa Hansen is a 32 y.o. at [redacted]w[redacted]d here in MAU reporting: Pt reports calf pain that started 1 week ago in the left leg. It is occasionally painful in the right, but mostly in the left.  Denies vaginal bleeding Denies LOF Reports+FM  Onset of complaint: 1 week ago Pain score: 1/10 There were no vitals filed for this visit.   FHR 155 Lab orders placed from triage:   none

## 2023-07-06 NOTE — MAU Provider Note (Signed)
B/l leg cramping in s/o hx of DVT,PE    S [name] is a 32 y.o. G2P0010 pregnant female at [redacted]w[redacted]d who presents to MAU today with complaint of bilateral shin cramping similar to Marissa Hansen hx of DVTs previously experienced after 12hr car ride.  Since that incident Marissa Hansen has been compliant with eliquis until transitioned to lovenox 60 BID given pregnancy.  Marissa Hansen states Marissa Hansen LLE had some swelling > RLE on 07/03/23 but resolved with rest and elevation.  Marissa Hansen also states the cramping started in LLE and now also in RLE.  Denies CP/SOB, palpitations, presyncope, VB, LOF.  Endorses +FM.    Receives care at [facility]. Prenatal records reviewed.  Pertinent items noted in HPI and remainder of comprehensive ROS otherwise negative.   O BP 124/70 (BP Location: Right Arm)   Pulse 99   Temp 98 F (36.7 C)   Resp 16   LMP 11/23/2022 (Exact Date)   SpO2 97%  Physical Exam Vitals and nursing note reviewed.  Constitutional:      General: Marissa Hansen is not in acute distress.    Appearance: Normal appearance. Marissa Hansen is not ill-appearing.  HENT:     Head: Normocephalic and atraumatic.     Right Ear: External ear normal.     Left Ear: External ear normal.     Nose: Nose normal.     Mouth/Throat:     Mouth: Mucous membranes are moist.     Pharynx: Oropharynx is clear.  Eyes:     Extraocular Movements: Extraocular movements intact.     Conjunctiva/sclera: Conjunctivae normal.  Cardiovascular:     Rate and Rhythm: Normal rate.  Pulmonary:     Effort: Pulmonary effort is normal. No respiratory distress.  Abdominal:     General: There is no distension.     Comments: gravid  Musculoskeletal:        General: No swelling or tenderness. Normal range of motion.     Cervical back: Normal range of motion.     Right lower leg: No edema.     Left lower leg: No edema.  Skin:    General: Skin is warm and dry.  Neurological:     Mental Status: Marissa Hansen is alert and oriented to person, place, and time. Mental status is at baseline.      Motor: No weakness.     Gait: Gait normal.  Psychiatric:        Mood and Affect: Mood normal.        Behavior: Behavior normal.      MDM: Low  MAU Course:  After speaking with patient, Marissa Hansen is compliant with Lovenox 60 BID w/o missing a dose, reviewed MFM guidelines on [email or web address] and found to be on adequate dosing for current BMI, and hearing HPI of it being bilateral and not unilateral I have a lower clinical concern for active DVT.  However given Marissa Hansen hx and current pregnancy will order DVT US for Marissa Hansen to return later this morning to have done to ensure no active disease.  Pt agreeable to    A&P: #[redacted] weeks gestation #Hx of DVT/PE #B/l lower extremity cramping  - compliant with same continued dose Lovenox 60 BID  - ordered DVT US for pt to return once vascular US tech in house  Discharge from MAU in stable condition with strict/usual precautions Follow up at [facility] as scheduled for ongoing prenatal care  Allergies as of 07/06/2023   No Known Allergies      Medication List  TAKE these medications    aspirin EC 81 MG tablet Take 1 tablet (81 mg total) by mouth daily. Swallow whole.   budesonide-formoterol 80-4.5 MCG/ACT inhaler Commonly known as: SYMBICORT Inhale 2 puffs into the lungs 2 (two) times daily.   enoxaparin 60 MG/0.6ML injection Commonly known as: LOVENOX Inject 0.6 mLs (60 mg total) into the skin every 12 (twelve) hours.   ferrous sulfate 325 (65 FE) MG tablet Take 1 tablet (325 mg total) by mouth daily with breakfast.   freestyle lancets Use as instructed   FREESTYLE LITE test strip Generic drug: glucose blood Use as instructed   FreeStyle Lite w/Device Kit Dispense 1 device to check fasting blood sugars and 2hr pp blood sugars   ipratropium-albuterol 0.5-2.5 (3) MG/3ML Soln Commonly known as: DUONEB Take 3 mLs by nebulization every 6 (six) hours as needed (Wheezing, shortness of breath).   multivitamin-prenatal 27-0.8 MG Tabs tablet Take 1  tablet by mouth daily at 12 noon.   valACYclovir 1000 MG tablet Commonly known as: VALTREX as needed.        Hessie Dibble, MD 07/06/2023 2:22 AM

## 2023-07-06 NOTE — Discharge Instructions (Signed)
IMPORTANT PATIENT INSTRUCTIONS:  You have been scheduled for an Outpatient Vascular Study at [facility].    If tomorrow is a Saturday, Sunday or holiday, please go to the [facility] Registration Desk at 11 am tomorrow morning and tell them you are there for a vascular study.   If tomorrow is a weekday (Monday-Friday), please go to  [facility]ospital En[facility] Registration at 11 am and tell them you are there for a vascular study. 

## 2023-07-06 NOTE — Progress Notes (Signed)
VASCULAR LAB    Bilateral lower extremity venous duplex has been performed.  See CV proc for preliminary results.   Leia Coletti, RVT 07/06/2023, 12:18 PM

## 2023-07-09 IMAGING — US US OB < 14 WEEKS - US OB TV
1 series · 15 of 28 positions shown · non-contrast
Comparison: None.

CLINICAL DATA: Vaginal bleeding.

EXAM:
TWIN OBSTETRIC <14WK US AND TRANSVAGINAL OB US
TECHNIQUE: Both transabdominal and transvaginal ultrasound examinations were
performed for complete evaluation of the gestation as well as the
maternal uterus, adnexal regions, and pelvic cul-de-sac.
Transvaginal technique was performed to assess early pregnancy.

[Series 1: us ob < 14 weeks - us ob tv · 15 of 64 slices shown]
[im 1/64]
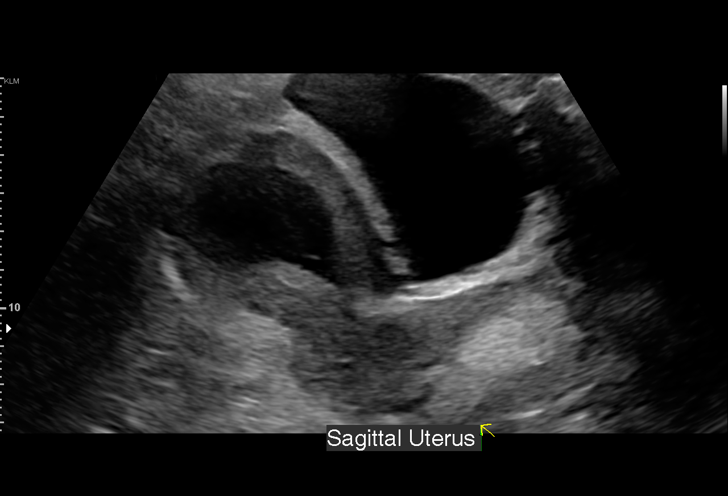
[im 5/64]
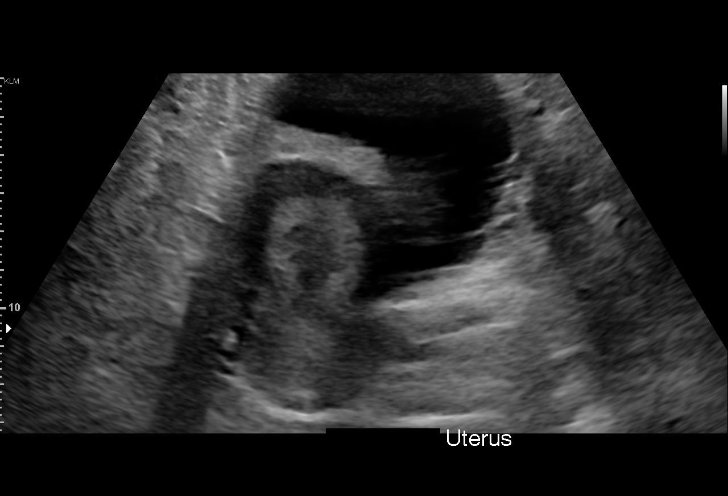
[im 10/64]
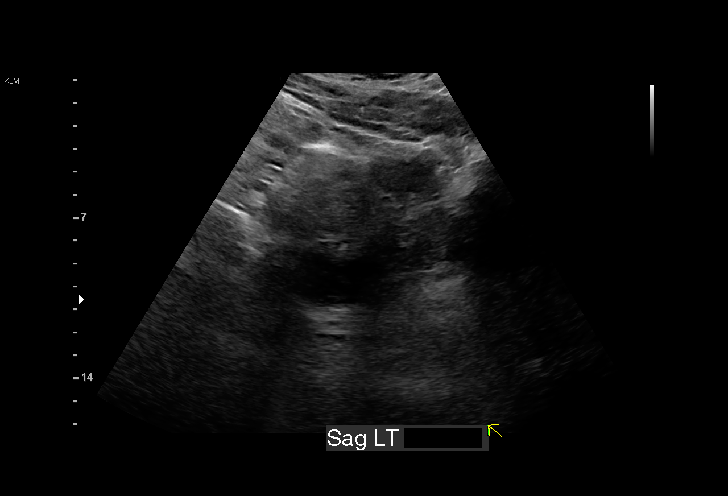
[im 15/64]
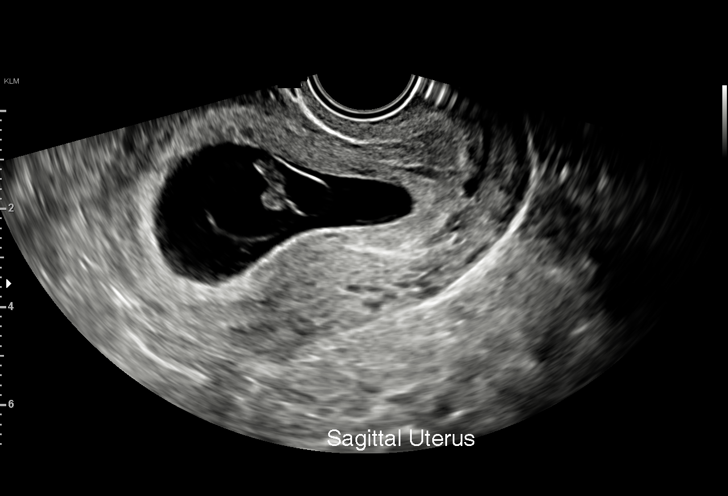
[im 19/64]
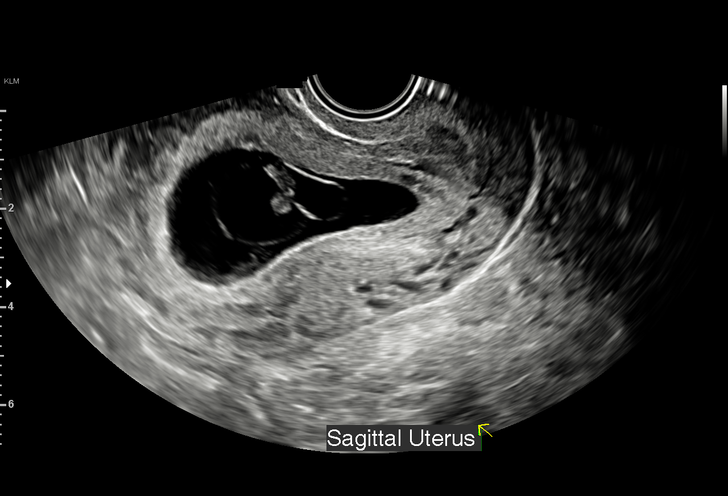
[im 24/64]
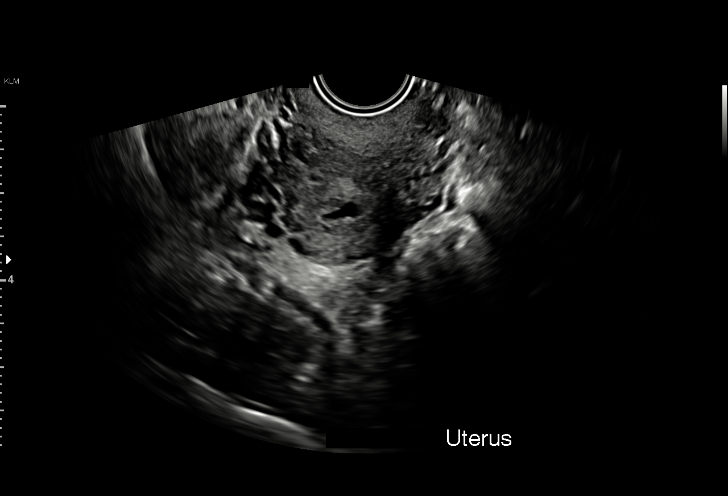
[im 29/64]
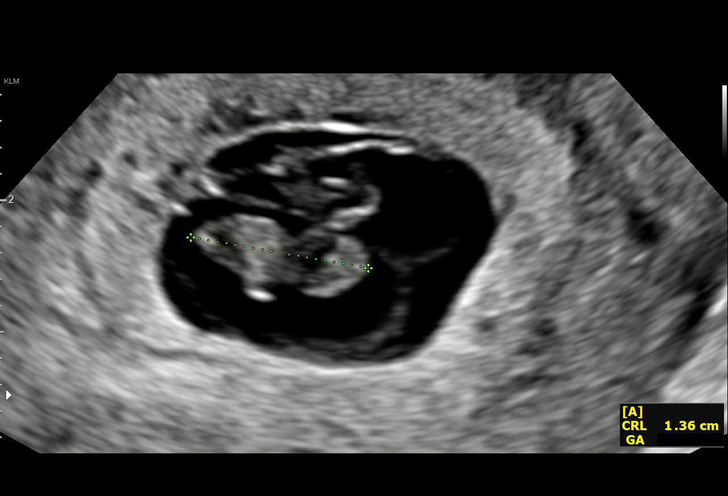
[im 33/64]
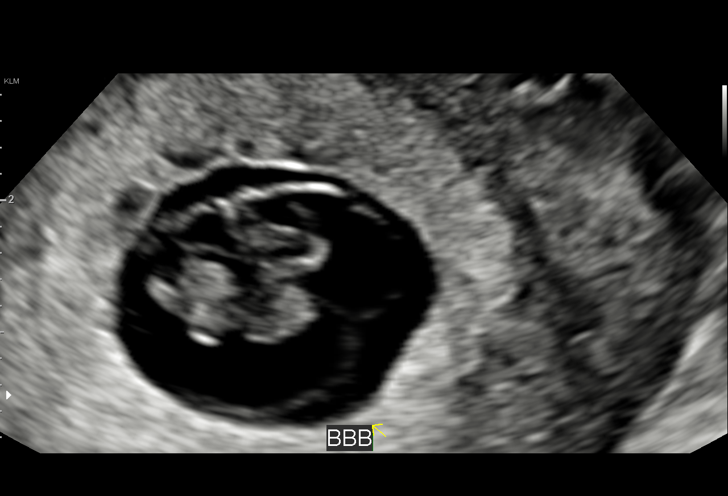
[im 36/64]
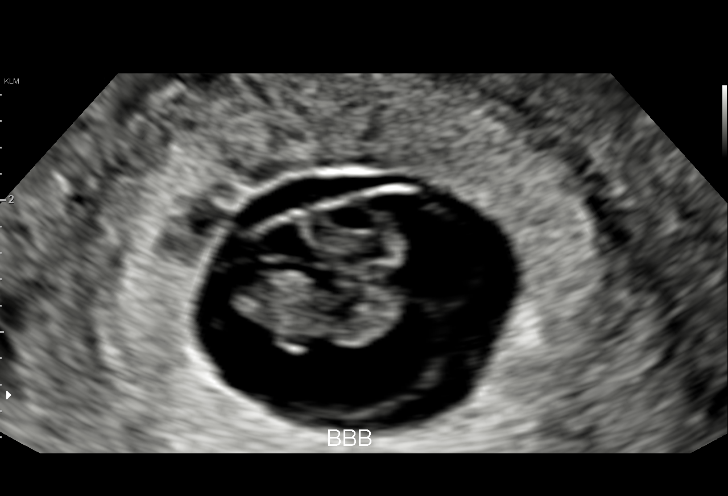
[im 40/64]
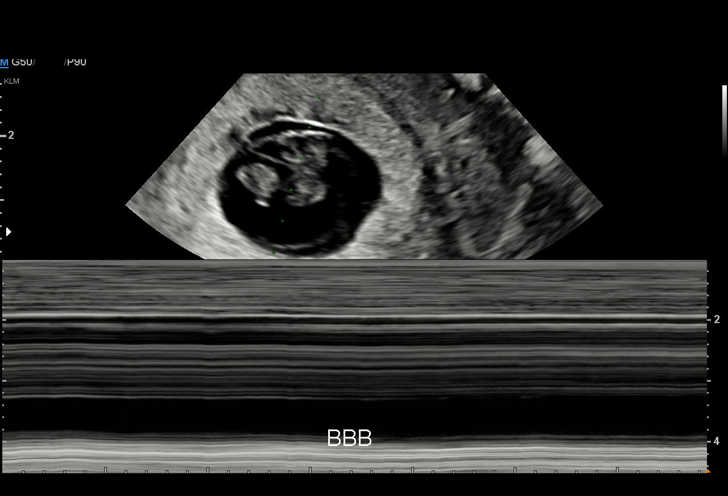
[im 45/64]
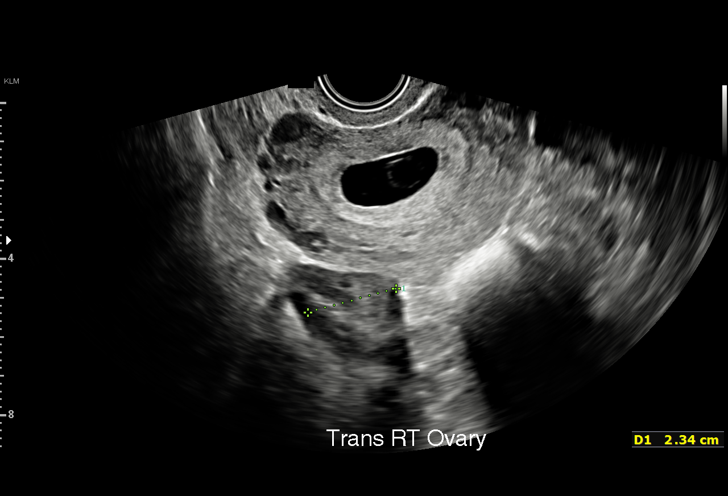
[im 50/64]
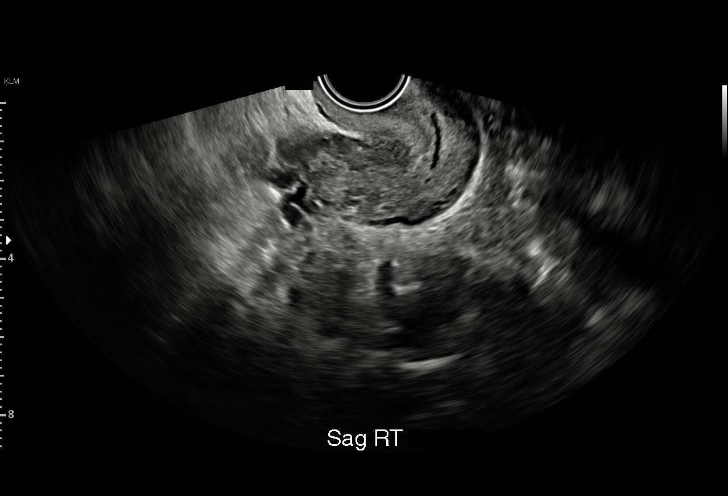
[im 54/64]
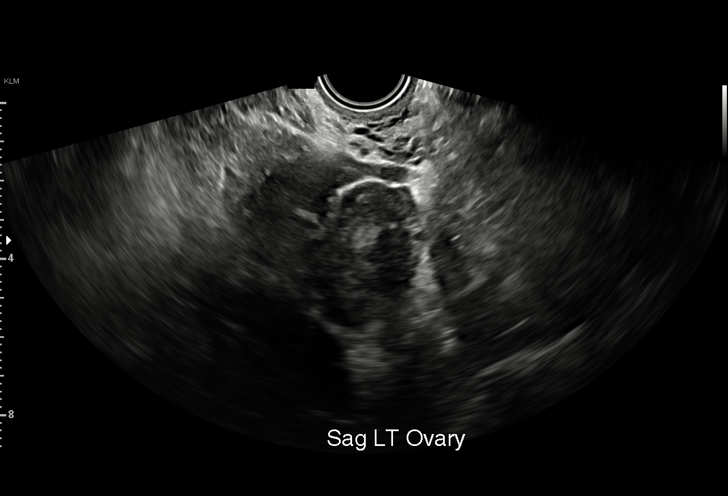
[im 59/64]
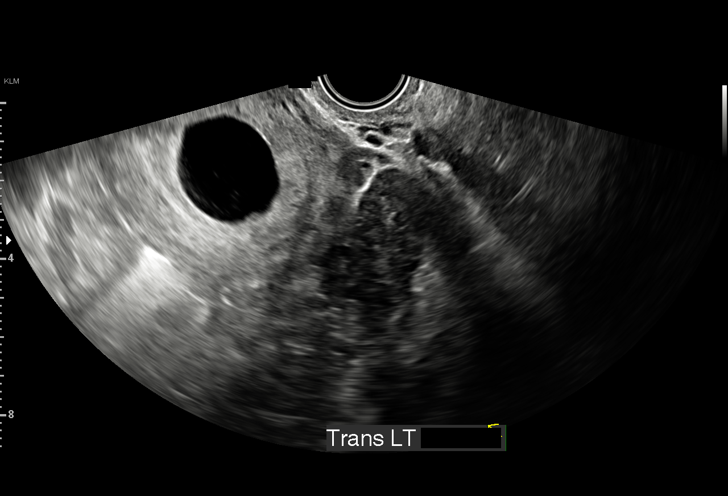
[im 64/64]
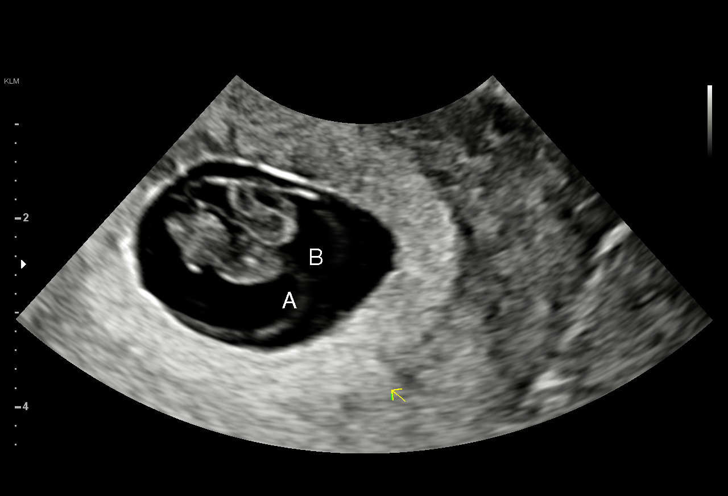

[15 of 28 positions shown; findings below may reference images not displayed]

FINDINGS: Number of IUPs:  2

Chorionicity/Amnionicity:  Monochorionic-diamniotic (thin membrane)

TWIN 1

Yolk sac:  Visualized.

Embryo:  Visualized.

Cardiac Activity: Not Visualized.

CRL:  13.4 mm   7 w 4 d                  US EDC: June 08, 2022.

TWIN 2

Yolk sac:  Visualized.

Embryo:  Visualized.

Cardiac Activity: Not Visualized.

CRL:  8.9 mm   6 w 5 d                  US EDC: June 14, 2022.

Subchorionic hemorrhage:  None visualized.

Maternal uterus/adnexae: Probable small corpus luteum cyst seen in
right ovary. Left ovary is unremarkable. No free fluid is noted.
IMPRESSION: Probable early twin intrauterine gestational sacs with yolk sacs and
fetal poles, but no cardiac activity yet visualized. Recommend
follow-up quantitative B-HCG levels and follow-up US in 14 days to
assess viability. This recommendation follows SRU consensus
guidelines: Diagnostic Criteria for Nonviable Pregnancy Early in the
First Trimester. N Engl J Med 4932; [DATE].

## 2023-07-11 ENCOUNTER — Other Ambulatory Visit: Payer: Self-pay

## 2023-07-11 ENCOUNTER — Other Ambulatory Visit: Payer: Self-pay | Admitting: Maternal & Fetal Medicine

## 2023-07-11 ENCOUNTER — Ambulatory Visit: Payer: 59 | Attending: Obstetrics and Gynecology

## 2023-07-11 DIAGNOSIS — O365930 Maternal care for other known or suspected poor fetal growth, third trimester, not applicable or unspecified: Secondary | ICD-10-CM

## 2023-07-11 DIAGNOSIS — E669 Obesity, unspecified: Secondary | ICD-10-CM | POA: Diagnosis not present

## 2023-07-11 DIAGNOSIS — O99213 Obesity complicating pregnancy, third trimester: Secondary | ICD-10-CM | POA: Diagnosis not present

## 2023-07-11 DIAGNOSIS — Z3A32 32 weeks gestation of pregnancy: Secondary | ICD-10-CM | POA: Diagnosis not present

## 2023-07-11 DIAGNOSIS — O365990 Maternal care for other known or suspected poor fetal growth, unspecified trimester, not applicable or unspecified: Secondary | ICD-10-CM | POA: Insufficient documentation

## 2023-07-16 NOTE — L&D Delivery Note (Signed)
Delivery Note Pushed effectively for almost 2 hours Fetal heart rate was reassuring throughout  At 6:01 AM a viable and healthy female was delivered via Vaginal, Spontaneous (Presentation: Left Occiput Anterior).  APGAR: 8, 9; weight  .   Placenta status: Spontaneous, Intact.  Cord: 3 vessels with the following complications: None.    Anesthesia: Epidural Episiotomy: None Lacerations: 1st degree;Periurethral Suture Repair: 3.0 monocryl Est. Blood Loss (mL): 1477  Mom to postpartum.  Baby to Couplet care / Skin to Skin.  Wynelle Bourgeois 08/13/2023, 6:25 AM

## 2023-07-17 ENCOUNTER — Ambulatory Visit: Payer: 59 | Admitting: *Deleted

## 2023-07-17 ENCOUNTER — Ambulatory Visit: Payer: 59 | Attending: Obstetrics and Gynecology

## 2023-07-17 ENCOUNTER — Other Ambulatory Visit: Payer: Self-pay | Admitting: *Deleted

## 2023-07-17 ENCOUNTER — Other Ambulatory Visit: Payer: Self-pay

## 2023-07-17 VITALS — BP 134/70 | HR 91

## 2023-07-17 DIAGNOSIS — O365930 Maternal care for other known or suspected poor fetal growth, third trimester, not applicable or unspecified: Secondary | ICD-10-CM | POA: Diagnosis not present

## 2023-07-17 DIAGNOSIS — O99213 Obesity complicating pregnancy, third trimester: Secondary | ICD-10-CM | POA: Diagnosis not present

## 2023-07-17 DIAGNOSIS — O0990 Supervision of high risk pregnancy, unspecified, unspecified trimester: Secondary | ICD-10-CM | POA: Diagnosis present

## 2023-07-17 DIAGNOSIS — O365920 Maternal care for other known or suspected poor fetal growth, second trimester, not applicable or unspecified: Secondary | ICD-10-CM | POA: Diagnosis present

## 2023-07-17 DIAGNOSIS — Z3A33 33 weeks gestation of pregnancy: Secondary | ICD-10-CM

## 2023-07-17 DIAGNOSIS — O365990 Maternal care for other known or suspected poor fetal growth, unspecified trimester, not applicable or unspecified: Secondary | ICD-10-CM | POA: Diagnosis present

## 2023-07-17 DIAGNOSIS — E669 Obesity, unspecified: Secondary | ICD-10-CM | POA: Diagnosis not present

## 2023-07-18 ENCOUNTER — Ambulatory Visit (HOSPITAL_BASED_OUTPATIENT_CLINIC_OR_DEPARTMENT_OTHER): Payer: 59 | Admitting: Obstetrics & Gynecology

## 2023-07-18 ENCOUNTER — Other Ambulatory Visit (HOSPITAL_BASED_OUTPATIENT_CLINIC_OR_DEPARTMENT_OTHER): Payer: Self-pay

## 2023-07-18 VITALS — BP 132/76 | HR 88 | Wt 282.2 lb

## 2023-07-18 DIAGNOSIS — R768 Other specified abnormal immunological findings in serum: Secondary | ICD-10-CM

## 2023-07-18 DIAGNOSIS — Z3A33 33 weeks gestation of pregnancy: Secondary | ICD-10-CM

## 2023-07-18 DIAGNOSIS — Z86718 Personal history of other venous thrombosis and embolism: Secondary | ICD-10-CM

## 2023-07-18 DIAGNOSIS — J4520 Mild intermittent asthma, uncomplicated: Secondary | ICD-10-CM

## 2023-07-18 DIAGNOSIS — O0990 Supervision of high risk pregnancy, unspecified, unspecified trimester: Secondary | ICD-10-CM

## 2023-07-18 DIAGNOSIS — Z8759 Personal history of other complications of pregnancy, childbirth and the puerperium: Secondary | ICD-10-CM

## 2023-07-18 DIAGNOSIS — O99013 Anemia complicating pregnancy, third trimester: Secondary | ICD-10-CM

## 2023-07-18 DIAGNOSIS — O365930 Maternal care for other known or suspected poor fetal growth, third trimester, not applicable or unspecified: Secondary | ICD-10-CM

## 2023-07-18 DIAGNOSIS — Z86711 Personal history of pulmonary embolism: Secondary | ICD-10-CM

## 2023-07-18 MED ORDER — VALACYCLOVIR HCL 500 MG PO TABS: 500.0000 mg | ORAL_TABLET | Freq: Two times a day (BID) | ORAL | 1 refills | Status: DC

## 2023-07-18 MED ORDER — ENOXAPARIN SODIUM 60 MG/0.6ML IJ SOSY: 60.0000 mg | PREFILLED_SYRINGE | Freq: Two times a day (BID) | INTRAMUSCULAR | 1 refills | Status: DC

## 2023-07-18 MED ORDER — ABRYSVO 120 MCG/0.5ML IM SOLR
0.5000 mL | Freq: Once | INTRAMUSCULAR | 0 refills | Status: AC
  Filled 2023-07-18: qty 0.5, 1d supply, fill #0

## 2023-07-18 NOTE — Progress Notes (Signed)
 PRENATAL VISIT NOTE  Subjective:  Marissa Hansen is a 33 y.o. G2P0010 at [redacted]w[redacted]d being seen today for ongoing prenatal care.  She is currently monitored for the following issues for this high-risk pregnancy and has Asthma exacerbation; History of deep vein thrombosis (DVT) of lower extremity, On Lovenox  BID; HSV-2 seropositive; Start Valtrex  at 36 weeks; Personal history of PE (pulmonary embolism); Supervision of high risk pregnancy, antepartum; Obesity affecting pregnancy, antepartum; Abnormal fetal ultrasound; Psychosocial stressors-resolved; History of miscarriage; Encounter for maternal care for suspected poor fetal growth in singleton pregnancy in second trimester; Asthma with acute exacerbation; IUGR (intrauterine growth restriction) affecting care of mother; and Anemia affecting pregnancy in third trimester on their problem list.  Patient reports  frustrations with weight gain.  Feels this is impacting mood but is planning to go back on medication for treatment of obesity after delivery.  Was seen in MAU due to concerns about DVT and evaluation was negative.  .  Have reviewed Lovenox  dosing with Dr. Meridith.  Is on 0.5mg /kg dosing.  She is 128lb but next increase would be 80mg  BID.  Dr. Meridith agreed this was a big dosing adjustment and to stay at 60mg  BID until pt is >120lbs.    Contractions: Not present. Vag. Bleeding: None.  Movement: Present. Denies leaking of fluid.   The following portions of the patient's history were reviewed and updated as appropriate: allergies, current medications, past family history, past medical history, past social history, past surgical history and problem list.   Objective:   Vitals:   07/18/23 0946  BP: 132/76  Pulse: 88  Weight: 282 lb 3.2 oz (128 kg)    Fetal Status: Fetal Heart Rate (bpm): 147 Fundal Height: 33 cm Movement: Present  Presentation: Vertex  General:  Alert, oriented and cooperative. Patient is in no acute distress.  Skin: Skin  is warm and dry. No rash noted.   Cardiovascular: Normal heart rate noted  Respiratory: Normal respiratory effort, no problems with respiration noted  Abdomen: Soft, gravid, appropriate for gestational age.  Pain/Pressure: Absent     Pelvic: Cervical exam deferred        Extremities: Normal range of motion.  Edema: None  Mental Status: Normal mood and affect. Normal behavior. Normal judgment and thought content.   Assessment and Plan:  Pregnancy: G2P0010 at [redacted]w[redacted]d 1. [redacted] weeks gestation of pregnancy (Primary) - Respiratory syncytial virus vaccine , preF, subunit, bivalent,(Abrysvo ) - on PNV - on baby ASA - recheck 2 weeks  2. HSV-2 seropositive - will start valtrex  now - valACYclovir  (VALTREX ) 500 MG tablet; Take 1 tablet (500 mg total) by mouth 2 (two) times daily.  Dispense: 60 tablet; Refill: 1  3. Supervision of high risk pregnancy, antepartum  4. History of pulmonary embolism - needs RF with current dosing.  We have already discussed recommendations for when to increase dosage. - enoxaparin  (LOVENOX ) 60 MG/0.6ML injection; Inject 0.6 mLs (60 mg total) into the skin every 12 (twelve) hours.  Dispense: 36 mL; Refill: 1  5. History of miscarriage  6. Anemia affecting pregnancy in third trimester - on iron  7. Poor fetal growth affecting management of mother in third trimester, single or unspecified fetus - having weekly BPPs, cord dopplers and q 3 week growth scans.  Most recent was yesterday, 07/17/2023.  Est. FW: 1860 gm 4 lb 2 oz 6 %.   - not in note but pt report recommendation made yesterday for delivery closer to 38 weeks  8. History of deep  vein thrombosis (DVT) of lower extremity, On Lovenox  BID  9. Mild intermittent asthma without complication - had exacerbation in pregnancy.  Stable now.  Preterm labor symptoms and general obstetric precautions including but not limited to vaginal bleeding, contractions, leaking of fluid and fetal movement were reviewed in detail with  the patient. Please refer to After Visit Summary for other counseling recommendations.   Return in about 2 weeks (around 08/01/2023).  Future Appointments  Date Time Provider Department Center  07/31/2023  3:30 PM [facility] US6 [facility]  08/01/2023  9:15 AM Lo, Arland POUR, CNM [facility]  08/08/2023 11:30 AM [facility] US6 [facility]  08/14/2023 11:30 AM [facility] US5 [facility]    Ronal GORMAN Pinal, MD

## 2023-07-30 ENCOUNTER — Other Ambulatory Visit (HOSPITAL_COMMUNITY)
Admission: RE | Admit: 2023-07-30 | Discharge: 2023-07-30 | Disposition: A | Discharge status: Home / Self Care | Payer: 59 | Source: Ambulatory Visit | Attending: Obstetrics & Gynecology | Admitting: Obstetrics & Gynecology

## 2023-07-30 ENCOUNTER — Ambulatory Visit (INDEPENDENT_AMBULATORY_CARE_PROVIDER_SITE_OTHER): Payer: 59 | Admitting: Obstetrics & Gynecology

## 2023-07-30 VITALS — BP 139/72 | HR 87 | Wt 281.2 lb

## 2023-07-30 DIAGNOSIS — Z86711 Personal history of pulmonary embolism: Secondary | ICD-10-CM

## 2023-07-30 DIAGNOSIS — O99013 Anemia complicating pregnancy, third trimester: Secondary | ICD-10-CM

## 2023-07-30 DIAGNOSIS — J4521 Mild intermittent asthma with (acute) exacerbation: Secondary | ICD-10-CM | POA: Diagnosis not present

## 2023-07-30 DIAGNOSIS — O0990 Supervision of high risk pregnancy, unspecified, unspecified trimester: Secondary | ICD-10-CM | POA: Diagnosis present

## 2023-07-30 DIAGNOSIS — O365930 Maternal care for other known or suspected poor fetal growth, third trimester, not applicable or unspecified: Secondary | ICD-10-CM

## 2023-07-30 DIAGNOSIS — Z86718 Personal history of other venous thrombosis and embolism: Secondary | ICD-10-CM

## 2023-07-30 DIAGNOSIS — R768 Other specified abnormal immunological findings in serum: Secondary | ICD-10-CM

## 2023-07-30 DIAGNOSIS — Z3A36 36 weeks gestation of pregnancy: Secondary | ICD-10-CM | POA: Diagnosis not present

## 2023-07-30 DIAGNOSIS — Z8759 Personal history of other complications of pregnancy, childbirth and the puerperium: Secondary | ICD-10-CM

## 2023-07-30 DIAGNOSIS — O99210 Obesity complicating pregnancy, unspecified trimester: Secondary | ICD-10-CM

## 2023-07-30 NOTE — Progress Notes (Signed)
   PRENATAL VISIT NOTE  Subjective:  Marissa Hansen is a 33 y.o. G2P0010 at [redacted]w[redacted]d being seen today for ongoing prenatal care.  She is currently monitored for the following issues for this high-risk pregnancy and has Asthma exacerbation; History of deep vein thrombosis (DVT) of lower extremity, On Lovenox BID; HSV-2 seropositive; Start Valtrex at 36 weeks; Personal history of PE (pulmonary embolism); Supervision of high risk pregnancy, antepartum; Obesity affecting pregnancy, antepartum; Abnormal fetal ultrasound; Psychosocial stressors-resolved; History of miscarriage; Encounter for maternal care for suspected poor fetal growth in singleton pregnancy in second trimester; Asthma with acute exacerbation; IUGR (intrauterine growth restriction) affecting care of mother; and Anemia affecting pregnancy in third trimester on their problem list.  Patient reports no complaints.  Contractions: Irregular.  .  Movement: Present. Denies leaking of fluid.   The following portions of the patient's history were reviewed and updated as appropriate: allergies, current medications, past family history, past medical history, past social history, past surgical history and problem list.   Objective:   Vitals:   07/30/23 1024  BP: 139/72  Pulse: 87  Weight: 281 lb 3.2 oz (127.6 kg)    Fetal Status: Fetal Heart Rate (bpm): 147   Movement: Present  Presentation: Vertex  General:  Alert, oriented and cooperative. Patient is in no acute distress.  Skin: Skin is warm and dry. No rash noted.   Cardiovascular: Normal heart rate noted  Respiratory: Normal respiratory effort, no problems with respiration noted  Abdomen: Soft, gravid, appropriate for gestational age.  Pain/Pressure: Absent     Pelvic: Cervical exam deferred        Extremities: Normal range of motion.  Edema: None  Mental Status: Normal mood and affect. Normal behavior. Normal judgment and thought content.   Assessment and Plan:  Pregnancy: G2P0010  at [redacted]w[redacted]d 1. Supervision of high risk pregnancy, antepartum (Primary) - follow up 1 week  2. [redacted] weeks gestation of pregnancy - GBS and GC/Chl obtained today  3. Anemia affecting pregnancy in third trimester - will check CBC today  4. Mild intermittent asthma with exacerbation - stable since exacerbation in late October  5. History of deep vein thrombosis (DVT) of lower extremity, On Lovenox BID - on Lovenox 0.5mg /kin BID per MFM.  Dr. Bryn Gulling did not recommend increasing dosage until BMI > due to next dosage increase being 80mg  BID  6. History of miscarriage  7. HSV-2 seropositive; Start Valtrex at 36 weeks - on Valtrex 500mg  bid   8. Poor fetal growth affecting management of mother in third trimester, single or unspecified fetus - has MFM follow up tomorrow with dopplers.  Did not have appt last week so modified BPP done today.  NST reactive and AFI 12.  9. Personal history of PE (pulmonary embolism)  10. Obesity affecting pregnancy, antepartum, unspecified obesity type   Preterm labor symptoms and general obstetric precautions including but not limited to vaginal bleeding, contractions, leaking of fluid and fetal movement were reviewed in detail with the patient. Please refer to After Visit Summary for other counseling recommendations.   Return in about 1 week (around 08/06/2023).  Future Appointments  Date Time Provider Department Center  07/31/2023  3:30 PM [facility] US6 [facility]  08/05/2023  8:55 AM Lo, Toma Aran, CNM [facility]  08/08/2023 11:30 AM [facility] US6 [facility]  08/14/2023 11:30 AM [facility] US5 [facility]    Jerene Bears, MD

## 2023-07-31 ENCOUNTER — Other Ambulatory Visit: Payer: Self-pay

## 2023-07-31 ENCOUNTER — Encounter (HOSPITAL_BASED_OUTPATIENT_CLINIC_OR_DEPARTMENT_OTHER): Payer: Self-pay | Admitting: Obstetrics & Gynecology

## 2023-07-31 ENCOUNTER — Ambulatory Visit: Payer: 59 | Attending: Obstetrics

## 2023-07-31 DIAGNOSIS — O365930 Maternal care for other known or suspected poor fetal growth, third trimester, not applicable or unspecified: Secondary | ICD-10-CM | POA: Insufficient documentation

## 2023-07-31 DIAGNOSIS — E669 Obesity, unspecified: Secondary | ICD-10-CM

## 2023-07-31 DIAGNOSIS — O99213 Obesity complicating pregnancy, third trimester: Secondary | ICD-10-CM

## 2023-07-31 DIAGNOSIS — Z3A36 36 weeks gestation of pregnancy: Secondary | ICD-10-CM

## 2023-07-31 DIAGNOSIS — Z3A35 35 weeks gestation of pregnancy: Secondary | ICD-10-CM

## 2023-07-31 DIAGNOSIS — O0990 Supervision of high risk pregnancy, unspecified, unspecified trimester: Secondary | ICD-10-CM

## 2023-07-31 LAB — CERVICOVAGINAL ANCILLARY ONLY
Chlamydia: NEGATIVE
Comment: NEGATIVE
Comment: NORMAL
Neisseria Gonorrhea: NEGATIVE

## 2023-08-01 ENCOUNTER — Encounter (HOSPITAL_BASED_OUTPATIENT_CLINIC_OR_DEPARTMENT_OTHER): Payer: 59 | Admitting: Certified Nurse Midwife

## 2023-08-03 LAB — CULTURE, BETA STREP (GROUP B ONLY): Strep Gp B Culture: NEGATIVE

## 2023-08-04 ENCOUNTER — Encounter (HOSPITAL_BASED_OUTPATIENT_CLINIC_OR_DEPARTMENT_OTHER): Payer: Self-pay | Admitting: Obstetrics & Gynecology

## 2023-08-05 ENCOUNTER — Encounter (HOSPITAL_BASED_OUTPATIENT_CLINIC_OR_DEPARTMENT_OTHER): Payer: Self-pay | Admitting: Certified Nurse Midwife

## 2023-08-05 ENCOUNTER — Ambulatory Visit (HOSPITAL_BASED_OUTPATIENT_CLINIC_OR_DEPARTMENT_OTHER): Payer: 59 | Admitting: Certified Nurse Midwife

## 2023-08-05 VITALS — BP 122/69 | HR 97 | Wt 282.4 lb

## 2023-08-05 DIAGNOSIS — Z3A37 37 weeks gestation of pregnancy: Secondary | ICD-10-CM

## 2023-08-05 DIAGNOSIS — O365930 Maternal care for other known or suspected poor fetal growth, third trimester, not applicable or unspecified: Secondary | ICD-10-CM

## 2023-08-05 DIAGNOSIS — R768 Other specified abnormal immunological findings in serum: Secondary | ICD-10-CM

## 2023-08-05 DIAGNOSIS — Z86718 Personal history of other venous thrombosis and embolism: Secondary | ICD-10-CM | POA: Diagnosis not present

## 2023-08-05 DIAGNOSIS — O365920 Maternal care for other known or suspected poor fetal growth, second trimester, not applicable or unspecified: Secondary | ICD-10-CM

## 2023-08-05 DIAGNOSIS — O0990 Supervision of high risk pregnancy, unspecified, unspecified trimester: Secondary | ICD-10-CM | POA: Diagnosis not present

## 2023-08-05 NOTE — Progress Notes (Signed)
    PRENATAL VISIT NOTE  Subjective:  Marissa Hansen is a 33 y.o. G2P0010 at [redacted]w[redacted]d being seen today for ongoing prenatal care.  She is currently monitored for the following issues for this high-risk pregnancy and has Asthma exacerbation; History of deep vein thrombosis (DVT) of lower extremity, On Lovenox BID; HSV-2 seropositive; Start Valtrex at 36 weeks; Personal history of PE (pulmonary embolism); Supervision of high risk pregnancy, antepartum; Obesity affecting pregnancy, antepartum; Abnormal fetal ultrasound; Psychosocial stressors-resolved; History of miscarriage; Encounter for maternal care for suspected poor fetal growth in singleton pregnancy in second trimester; Asthma with acute exacerbation; IUGR (intrauterine growth restriction) affecting care of mother; and Anemia affecting pregnancy in third trimester on their problem list.  Patient reports no bleeding, no contractions, no cramping, and no leaking.  Contractions: Not present. Vag. Bleeding: None.  Movement: Present. Denies leaking of fluid.   The following portions of the patient's history were reviewed and updated as appropriate: allergies, current medications, past family history, past medical history, past social history, past surgical history and problem list.   Objective:   Vitals:   08/05/23 0908  BP: 122/69  Pulse: 97  Weight: 282 lb 6.4 oz (128.1 kg)    Fetal Status: Fetal Heart Rate (bpm): 150   Movement: Present     General:  Alert, oriented and cooperative. Patient is in no acute distress.  Skin: Skin is warm and dry. No rash noted.   Cardiovascular: Normal heart rate noted  Respiratory: Normal respiratory effort, no problems with respiration noted  Abdomen: Soft, gravid, appropriate for gestational age.  Pain/Pressure: Present     Pelvic: Cervical exam deferred        Extremities: Normal range of motion.  Edema: None  Mental Status: Normal mood and affect. Normal behavior. Normal judgment and thought  content.   Assessment and Plan:  Pregnancy: G2P0010 at [redacted]w[redacted]d  1. Supervision of high risk pregnancy, antepartum (Primary) - GC/CT and GBS Negative - [place] (1/2): Vtx, posterior placenta, EFW 4lb 2oz (6%), HC <1%, AC 2.2%, FL 25%. AFI wnl. BPP 8/8. Normal doppler flow studies. Weekly BPP and UAD studies. Next [place] 08/08/23.   2. History of deep vein thrombosis (DVT) of lower extremity, On Lovenox BID - on Lovenox 0.5mg /kin BID per MFM. Dr. Bryn Gulling did not recommend increasing dosage until BMI > due to next dosage increase being 80mg  BID   3. HSV-2 seropositive; Start Valtrex at 36 weeks - On Valtrex 500mg  BID  4. Encounter for maternal care for suspected poor fetal growth in singleton pregnancy in second trimester - Follow-up US scheduled  5. Poor fetal growth affecting management of mother in third trimester, single or unspecified fetus - Follow-up US pending  Preterm labor symptoms and general obstetric precautions including but not limited to vaginal bleeding, contractions, leaking of fluid and fetal movement were reviewed in detail with the patient. Please refer to After Visit Summary for other counseling recommendations.   No follow-ups on file.  Future Appointments  Date Time Provider Department Center  08/08/2023 11:30 AM [facility] US6 [facility]  08/11/2023  8:55 AM Jada Kuhnert, Toma Aran, CNM [facility]  08/14/2023 11:30 AM [facility] US5 [facility]  08/19/2023  7:00 AM [facility] SCHED ROOM [facility] None    Letta Kocher, CNM

## 2023-08-06 ENCOUNTER — Encounter: Payer: Self-pay | Admitting: *Deleted

## 2023-08-07 ENCOUNTER — Encounter (HOSPITAL_BASED_OUTPATIENT_CLINIC_OR_DEPARTMENT_OTHER): Payer: Self-pay | Admitting: Obstetrics & Gynecology

## 2023-08-07 ENCOUNTER — Ambulatory Visit (HOSPITAL_BASED_OUTPATIENT_CLINIC_OR_DEPARTMENT_OTHER): Payer: 59 | Admitting: Obstetrics & Gynecology

## 2023-08-07 ENCOUNTER — Telehealth (HOSPITAL_BASED_OUTPATIENT_CLINIC_OR_DEPARTMENT_OTHER): Payer: Self-pay | Admitting: *Deleted

## 2023-08-07 VITALS — BP 141/77 | HR 86 | Wt 286.8 lb

## 2023-08-07 DIAGNOSIS — R519 Headache, unspecified: Secondary | ICD-10-CM

## 2023-08-07 DIAGNOSIS — D649 Anemia, unspecified: Secondary | ICD-10-CM

## 2023-08-07 DIAGNOSIS — O09293 Supervision of pregnancy with other poor reproductive or obstetric history, third trimester: Secondary | ICD-10-CM

## 2023-08-07 DIAGNOSIS — O99013 Anemia complicating pregnancy, third trimester: Secondary | ICD-10-CM | POA: Diagnosis not present

## 2023-08-07 DIAGNOSIS — Z3A36 36 weeks gestation of pregnancy: Secondary | ICD-10-CM

## 2023-08-07 DIAGNOSIS — R030 Elevated blood-pressure reading, without diagnosis of hypertension: Secondary | ICD-10-CM

## 2023-08-07 DIAGNOSIS — O365930 Maternal care for other known or suspected poor fetal growth, third trimester, not applicable or unspecified: Secondary | ICD-10-CM

## 2023-08-07 DIAGNOSIS — O99891 Other specified diseases and conditions complicating pregnancy: Secondary | ICD-10-CM

## 2023-08-07 DIAGNOSIS — R768 Other specified abnormal immunological findings in serum: Secondary | ICD-10-CM

## 2023-08-07 DIAGNOSIS — O0990 Supervision of high risk pregnancy, unspecified, unspecified trimester: Secondary | ICD-10-CM

## 2023-08-07 DIAGNOSIS — O98813 Other maternal infectious and parasitic diseases complicating pregnancy, third trimester: Secondary | ICD-10-CM

## 2023-08-07 DIAGNOSIS — B009 Herpesviral infection, unspecified: Secondary | ICD-10-CM

## 2023-08-07 DIAGNOSIS — O26899 Other specified pregnancy related conditions, unspecified trimester: Secondary | ICD-10-CM

## 2023-08-07 DIAGNOSIS — O163 Unspecified maternal hypertension, third trimester: Secondary | ICD-10-CM | POA: Diagnosis not present

## 2023-08-07 DIAGNOSIS — Z86718 Personal history of other venous thrombosis and embolism: Secondary | ICD-10-CM

## 2023-08-07 DIAGNOSIS — O365920 Maternal care for other known or suspected poor fetal growth, second trimester, not applicable or unspecified: Secondary | ICD-10-CM

## 2023-08-07 DIAGNOSIS — R110 Nausea: Secondary | ICD-10-CM

## 2023-08-07 NOTE — Telephone Encounter (Signed)
Pt called with complaints of having an episode this morning of sudden, intense, hypersalivation and nausea accompanied by a mild headache and lasting about 2 min. Pt reports normal blood pressures. Pt denies vaginal bleeding and regular contractions. She reports good fetal movement. Pt did work a 14 hour shift last night. Offered pt an appt this morning. She preferred to come this afternoon. Pt scheduled with provider today at 235.

## 2023-08-08 ENCOUNTER — Ambulatory Visit: Payer: 59 | Attending: Obstetrics | Admitting: Obstetrics

## 2023-08-08 ENCOUNTER — Other Ambulatory Visit: Payer: Self-pay

## 2023-08-08 ENCOUNTER — Ambulatory Visit: Payer: 59 | Attending: Obstetrics

## 2023-08-08 ENCOUNTER — Other Ambulatory Visit: Payer: Self-pay | Admitting: Obstetrics and Gynecology

## 2023-08-08 ENCOUNTER — Encounter (HOSPITAL_BASED_OUTPATIENT_CLINIC_OR_DEPARTMENT_OTHER): Payer: Self-pay | Admitting: Obstetrics & Gynecology

## 2023-08-08 DIAGNOSIS — Z86718 Personal history of other venous thrombosis and embolism: Secondary | ICD-10-CM

## 2023-08-08 DIAGNOSIS — O133 Gestational [pregnancy-induced] hypertension without significant proteinuria, third trimester: Secondary | ICD-10-CM | POA: Diagnosis not present

## 2023-08-08 DIAGNOSIS — O365930 Maternal care for other known or suspected poor fetal growth, third trimester, not applicable or unspecified: Secondary | ICD-10-CM | POA: Diagnosis present

## 2023-08-08 DIAGNOSIS — O99213 Obesity complicating pregnancy, third trimester: Secondary | ICD-10-CM

## 2023-08-08 DIAGNOSIS — Z3A36 36 weeks gestation of pregnancy: Secondary | ICD-10-CM

## 2023-08-08 DIAGNOSIS — O09893 Supervision of other high risk pregnancies, third trimester: Secondary | ICD-10-CM | POA: Diagnosis not present

## 2023-08-08 DIAGNOSIS — E669 Obesity, unspecified: Secondary | ICD-10-CM

## 2023-08-08 LAB — CBC
Hematocrit: 36 % (ref 34.0–46.6)
Hemoglobin: 11.5 g/dL (ref 11.1–15.9)
MCH: 25.4 pg — ABNORMAL LOW (ref 26.6–33.0)
MCHC: 31.9 g/dL (ref 31.5–35.7)
MCV: 80 fL (ref 79–97)
Platelets: 353 10*3/uL (ref 150–450)
RBC: 4.52 x10E6/uL (ref 3.77–5.28)
RDW: 17.4 % — ABNORMAL HIGH (ref 11.7–15.4)
WBC: 13.6 10*3/uL — ABNORMAL HIGH (ref 3.4–10.8)

## 2023-08-08 LAB — COMPREHENSIVE METABOLIC PANEL
ALT: 16 [IU]/L (ref 0–32)
AST: 13 [IU]/L (ref 0–40)
Albumin: 3.6 g/dL — ABNORMAL LOW (ref 3.9–4.9)
Alkaline Phosphatase: 87 [IU]/L (ref 44–121)
BUN/Creatinine Ratio: 12 (ref 9–23)
BUN: 7 mg/dL (ref 6–20)
Bilirubin Total: <0.2 mg/dL (ref 0.0–1.2)
CO2: 22 mmol/L (ref 20–29)
Calcium: 9.3 mg/dL (ref 8.7–10.2)
Chloride: 103 mmol/L (ref 96–106)
Creatinine, Ser: 0.6 mg/dL (ref 0.57–1.00)
Globulin, Total: 2.7 g/dL (ref 1.5–4.5)
Glucose: 78 mg/dL (ref 70–99)
Potassium: 4.4 mmol/L (ref 3.5–5.2)
Sodium: 138 mmol/L (ref 134–144)
Total Protein: 6.3 g/dL (ref 6.0–8.5)
eGFR: 121 mL/min/{1.73_m2} (ref >59)

## 2023-08-08 LAB — PROTEIN / CREATININE RATIO, URINE
Creatinine, Urine: 56.4 mg/dL
Protein, Ur: 8.7 mg/dL
Protein/Creat Ratio: 154 mg/g{creat} (ref 0–200)

## 2023-08-08 NOTE — Progress Notes (Signed)
PRENATAL VISIT NOTE  Subjective:  Marissa Hansen is a 33 y.o. G2P0010 at [redacted]w[redacted]d being seen today for ongoing prenatal care.  She is currently monitored for the following issues for this high-risk pregnancy and has History of deep vein thrombosis (DVT) of lower extremity, On Lovenox BID; HSV-2 seropositive; Start Valtrex at 36 weeks; Personal history of PE (pulmonary embolism); Supervision of high risk pregnancy, antepartum; Obesity affecting pregnancy, antepartum; Abnormal fetal ultrasound; Psychosocial stressors-resolved; History of miscarriage; Encounter for maternal care for suspected poor fetal growth in singleton pregnancy in second trimester; Asthma with acute exacerbation; IUGR (intrauterine growth restriction) affecting care of mother; and Anemia affecting pregnancy in third trimester on their problem list.  Patient reports  she had a headache this morning and associated nausea.  No emesis.  No RUQ pain.  She didn't really take anything and is feeling better  .  Contractions: Not present.  .  Movement: Present. Denies leaking of fluid.   The following portions of the patient's history were reviewed and updated as appropriate: allergies, current medications, past family history, past medical history, past social history, past surgical history and problem list.   Objective:   Vitals:   08/07/23 1507  BP: (!) 141/77  Pulse: 86  Weight: 286 lb 12.8 oz (130.1 kg)    Fetal Status: Fetal Heart Rate (bpm): 158 Fundal Height: 38 cm Movement: Present  Presentation: Vertex  General:  Alert, oriented and cooperative. Patient is in no acute distress.  Skin: Skin is warm and dry. No rash noted.   Cardiovascular: Normal heart rate noted  Respiratory: Normal respiratory effort, no problems with respiration noted  Abdomen: Soft, gravid, appropriate for gestational age.  Pain/Pressure: Absent     Pelvic: Cervical exam deferred        Extremities: Normal range of motion.  Edema: None  Mental  Status: Normal mood and affect. Normal behavior. Normal judgment and thought content.   Assessment and Plan:  Pregnancy: G2P0010 at [redacted]w[redacted]d 1. Supervision of high risk pregnancy, antepartum (Primary) - on PNV and baby ASA - has MFM appt tomorrow for growth and dopplers  2. [redacted] weeks gestation of pregnancy - if evaluation with lab work ok today and MFM appt ok tomorrow, will recheck on Monday.  Already has appt scheduled  3. Elevated blood pressure reading - CBC - Comprehensive metabolic panel - Protein / creatinine ratio, urine  4. Anemia affecting pregnancy in third trimester - repeat hb today  5. History of deep vein thrombosis (DVT) of lower extremity, On Lovenox BID - will confirm with anesthesia but advised to stop ASA 5 days before induction and hold at least PM dose of lovenox.  Will confirm if need to hold for 24 hours.  6. Poor fetal growth affecting management of mother in third trimester, single or unspecified fetus - growth scan, BPP, dopplers weekly.  Has appt with MFM tomorrow.  7. HSV-2 seropositive; Start Valtrex at 36 weeks - on valtrex bid  8. Acute nonintractable headache, unspecified headache type  9. Pregnancy related nausea, antepartum   Preterm labor symptoms and general obstetric precautions including but not limited to vaginal bleeding, contractions, leaking of fluid and fetal movement were reviewed in detail with the patient. Please refer to After Visit Summary for other counseling recommendations.   Return in about 1 week (around 08/14/2023).  Future Appointments  Date Time Provider Department Center  08/11/2023 12:00 AM [facility] SCHED ROOM [facility] None  08/19/2023  7:00 AM [facility] SCHED ROOM [facility]  None    Jerene Bears, MD

## 2023-08-08 NOTE — Progress Notes (Signed)
OB Telephone Note Dr. Parke Poisson recommends 37wk delivery due to [facility] and FGR. Patient has to work this weekend and prefers to avoid having to take time off, per Dr. Parke Poisson. Patient set up for IOL on Sunday, January 26th at 1145pm.  Dr. Parke Poisson to let patient know and when to stop taking her lovenox.   Cornelia Copa MD Attending Center for [employer] (Faculty Practice) 08/08/2023 Time: 1236pm

## 2023-08-08 NOTE — Progress Notes (Signed)
MFM Consult Note  Marissa Hansen is currently at 36 weeks and 6 days.  She has been followed due to IUGR.  Due to a history of a prior DVT, she is currently treated with Lovenox 60 mg twice a day.    During her prenatal visit 3 days ago, she was noted to have elevated blood pressures.  Her PIH labs drawn at that time were all within normal limits.  A P/C ratio is currently pending.   Her blood pressures today were 143/79 and 142/82.  She is reporting a mild headache.  On today's exam, the overall EFW 5 pounds 11 ounces measures at the 13th percentile for her gestational age.  However, the fetal abdominal circumference measures at the 9th percentile indicating IUGR.   The total AFI was 13.97 cm (within normal limits).  A BPP performed today was 8 out of 8.    Doppler studies of the umbilical arteries showed a normal S/D ratio of 2.4 .  There were no signs of absent or reversed end-diastolic flow.    The fetus is in the vertex presentation.  Due to IUGR and gestational hypertension, delivery is recommended at around 37 weeks.  I have made arrangements for the patient to start her induction on Sunday, June 09, 2024 at 11:45 PM (in 2 days).  She was advised to stop her daily baby aspirin today and to hold Lovenox for 24 hours prior to her scheduled induction.  his means that her last dose of Lovenox should be given on Saturday night.    Preeclampsia precautions were reviewed today.    The patient understands that she should go to the hospital should her headache worsen.  She will also monitor her blood pressures at home and at work and will go to the hospital should her blood pressures be persistently in the 150s over high 90s range.  The patient stated that all of her questions were answered today and agrees with the plan for delivery.  A total of 30 minutes was spent counseling and coordinating the care for this patient.  Greater than 50% of the time was spent in direct face-to-face  contact.

## 2023-08-10 ENCOUNTER — Telehealth: Payer: Self-pay | Admitting: Family Medicine

## 2023-08-10 NOTE — Telephone Encounter (Signed)
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  She understands that she is officially dx'ed with GHTN and recommendations is to have delivery at 37weeks.  Her FGR resolved with last EFW 13% at [redacted]w[redacted]d.  She has not had HA since taking tylenol, denies CP / SOB / RUQ pain / new or worsening swelling.  She did hold her BID lovenox dosing today as instructed but can take this evening's dose.  She desires to delay until 1/28 at midnight.  She can ensure to not have taken lovenox before 24hrs before presenting at that time and understands the risk of her missing a dose of lovenox as well as risk of developing PreE.  Pt still wishes to delay if she could, clarified and permitted after discussing with attending.  Attending was able to get her rescheduled for 0001 on 08/12/23 while I counseled on strict precautions of PreE / DVT / PE / Labor.  Pt understanding and agreeable with plan including no doses of lovenox 24 hrs prior to IOL.   Marissa Bodo, MD Family Medicine - Obstetrics Fellow

## 2023-08-11 ENCOUNTER — Encounter (HOSPITAL_COMMUNITY): Payer: Self-pay | Admitting: *Deleted

## 2023-08-11 ENCOUNTER — Inpatient Hospital Stay (HOSPITAL_COMMUNITY): Payer: 59

## 2023-08-11 ENCOUNTER — Telehealth (HOSPITAL_COMMUNITY): Payer: Self-pay | Admitting: *Deleted

## 2023-08-11 ENCOUNTER — Encounter (HOSPITAL_BASED_OUTPATIENT_CLINIC_OR_DEPARTMENT_OTHER): Payer: 59 | Admitting: Certified Nurse Midwife

## 2023-08-11 NOTE — Telephone Encounter (Signed)
Preadmission screen

## 2023-08-12 ENCOUNTER — Inpatient Hospital Stay (HOSPITAL_COMMUNITY)
Admission: RE | Admit: 2023-08-12 | Discharge: 2023-08-15 | DRG: 806 | Disposition: A | Discharge status: Home / Self Care | Payer: 59 | Attending: Obstetrics & Gynecology | Admitting: Obstetrics & Gynecology

## 2023-08-12 ENCOUNTER — Inpatient Hospital Stay (HOSPITAL_COMMUNITY): Payer: 59

## 2023-08-12 ENCOUNTER — Inpatient Hospital Stay (HOSPITAL_COMMUNITY): Payer: 59 | Admitting: Anesthesiology

## 2023-08-12 ENCOUNTER — Other Ambulatory Visit: Payer: Self-pay

## 2023-08-12 ENCOUNTER — Encounter (HOSPITAL_COMMUNITY): Payer: Self-pay | Admitting: Family Medicine

## 2023-08-12 DIAGNOSIS — O99210 Obesity complicating pregnancy, unspecified trimester: Secondary | ICD-10-CM | POA: Diagnosis present

## 2023-08-12 DIAGNOSIS — O365930 Maternal care for other known or suspected poor fetal growth, third trimester, not applicable or unspecified: Secondary | ICD-10-CM | POA: Diagnosis present

## 2023-08-12 DIAGNOSIS — O99214 Obesity complicating childbirth: Secondary | ICD-10-CM | POA: Diagnosis present

## 2023-08-12 DIAGNOSIS — O9952 Diseases of the respiratory system complicating childbirth: Secondary | ICD-10-CM | POA: Diagnosis present

## 2023-08-12 DIAGNOSIS — J45909 Unspecified asthma, uncomplicated: Secondary | ICD-10-CM | POA: Diagnosis present

## 2023-08-12 DIAGNOSIS — Z833 Family history of diabetes mellitus: Secondary | ICD-10-CM | POA: Diagnosis not present

## 2023-08-12 DIAGNOSIS — O1092 Unspecified pre-existing hypertension complicating childbirth: Secondary | ICD-10-CM | POA: Diagnosis present

## 2023-08-12 DIAGNOSIS — O134 Gestational [pregnancy-induced] hypertension without significant proteinuria, complicating childbirth: Principal | ICD-10-CM | POA: Diagnosis present

## 2023-08-12 DIAGNOSIS — Z86711 Personal history of pulmonary embolism: Secondary | ICD-10-CM | POA: Diagnosis not present

## 2023-08-12 DIAGNOSIS — O9902 Anemia complicating childbirth: Secondary | ICD-10-CM | POA: Diagnosis present

## 2023-08-12 DIAGNOSIS — Z8249 Family history of ischemic heart disease and other diseases of the circulatory system: Secondary | ICD-10-CM | POA: Diagnosis not present

## 2023-08-12 DIAGNOSIS — A6000 Herpesviral infection of urogenital system, unspecified: Secondary | ICD-10-CM | POA: Diagnosis present

## 2023-08-12 DIAGNOSIS — O365990 Maternal care for other known or suspected poor fetal growth, unspecified trimester, not applicable or unspecified: Principal | ICD-10-CM | POA: Diagnosis present

## 2023-08-12 DIAGNOSIS — O139 Gestational [pregnancy-induced] hypertension without significant proteinuria, unspecified trimester: Secondary | ICD-10-CM | POA: Insufficient documentation

## 2023-08-12 DIAGNOSIS — O9832 Other infections with a predominantly sexual mode of transmission complicating childbirth: Secondary | ICD-10-CM | POA: Diagnosis present

## 2023-08-12 DIAGNOSIS — Z7951 Long term (current) use of inhaled steroids: Secondary | ICD-10-CM | POA: Diagnosis not present

## 2023-08-12 DIAGNOSIS — Z7982 Long term (current) use of aspirin: Secondary | ICD-10-CM | POA: Diagnosis not present

## 2023-08-12 DIAGNOSIS — Z8616 Personal history of COVID-19: Secondary | ICD-10-CM

## 2023-08-12 DIAGNOSIS — D509 Iron deficiency anemia, unspecified: Secondary | ICD-10-CM | POA: Diagnosis present

## 2023-08-12 DIAGNOSIS — O479 False labor, unspecified: Secondary | ICD-10-CM

## 2023-08-12 DIAGNOSIS — Z3A37 37 weeks gestation of pregnancy: Secondary | ICD-10-CM | POA: Diagnosis not present

## 2023-08-12 DIAGNOSIS — Z86718 Personal history of other venous thrombosis and embolism: Secondary | ICD-10-CM | POA: Diagnosis not present

## 2023-08-12 LAB — CBC
HCT: 35.1 % — ABNORMAL LOW (ref 36.0–46.0)
HCT: 34.7 % — ABNORMAL LOW (ref 36.0–46.0)
Hemoglobin: 11.3 g/dL — ABNORMAL LOW (ref 12.0–15.0)
Hemoglobin: 11.3 g/dL — ABNORMAL LOW (ref 12.0–15.0)
MCH: 26 pg (ref 26.0–34.0)
MCH: 26.2 pg (ref 26.0–34.0)
MCHC: 32.2 g/dL (ref 30.0–36.0)
MCHC: 32.6 g/dL (ref 30.0–36.0)
MCV: 80.7 fL (ref 80.0–100.0)
MCV: 80.3 fL (ref 80.0–100.0)
Platelets: 313 10*3/uL (ref 150–400)
Platelets: 323 10*3/uL (ref 150–400)
RBC: 4.35 MIL/uL (ref 3.87–5.11)
RBC: 4.32 MIL/uL (ref 3.87–5.11)
RDW: 18.6 % — ABNORMAL HIGH (ref 11.5–15.5)
RDW: 18.6 % — ABNORMAL HIGH (ref 11.5–15.5)
WBC: 12.1 10*3/uL — ABNORMAL HIGH (ref 4.0–10.5)
WBC: 11.7 10*3/uL — ABNORMAL HIGH (ref 4.0–10.5)
nRBC: 0 % (ref 0.0–0.2)
nRBC: 0 % (ref 0.0–0.2)

## 2023-08-12 LAB — COMPREHENSIVE METABOLIC PANEL
ALT: 15 U/L (ref 0–44)
AST: 16 U/L (ref 15–41)
Albumin: 2.7 g/dL — ABNORMAL LOW (ref 3.5–5.0)
Alkaline Phosphatase: 67 U/L (ref 38–126)
Anion gap: 12 (ref 5–15)
BUN: 7 mg/dL (ref 6–20)
CO2: 19 mmol/L — ABNORMAL LOW (ref 22–32)
Calcium: 8.9 mg/dL (ref 8.9–10.3)
Chloride: 104 mmol/L (ref 98–111)
Creatinine, Ser: 0.56 mg/dL (ref 0.44–1.00)
GFR, Estimated: >60 mL/min (ref >60)
Glucose, Bld: 86 mg/dL (ref 70–99)
Potassium: 3.5 mmol/L (ref 3.5–5.1)
Sodium: 135 mmol/L (ref 135–145)
Total Bilirubin: 0.4 mg/dL (ref 0.0–1.2)
Total Protein: 6.1 g/dL — ABNORMAL LOW (ref 6.5–8.1)

## 2023-08-12 LAB — PROTEIN / CREATININE RATIO, URINE
Creatinine, Urine: 210 mg/dL
Protein Creatinine Ratio: 0.11 mg/mg{creat} (ref 0.00–0.15)
Total Protein, Urine: 24 mg/dL

## 2023-08-12 LAB — TYPE AND SCREEN
ABO/RH(D): B POS
Antibody Screen: NEGATIVE

## 2023-08-12 LAB — PROTIME-INR
INR: 0.9 (ref 0.8–1.2)
Prothrombin Time: 12.5 s (ref 11.4–15.2)

## 2023-08-12 LAB — APTT: aPTT: 24 s (ref 24–36)

## 2023-08-12 LAB — FIBRINOGEN: Fibrinogen: 575 mg/dL — ABNORMAL HIGH (ref 210–475)

## 2023-08-12 LAB — RPR: RPR Ser Ql: NONREACTIVE

## 2023-08-12 MED ORDER — LACTATED RINGERS IV SOLN: INTRAVENOUS | Status: DC

## 2023-08-12 MED ORDER — EPHEDRINE 5 MG/ML INJ: 10.0000 mg | INTRAVENOUS | Status: DC | PRN

## 2023-08-12 MED ORDER — ONDANSETRON HCL 4 MG/2ML IJ SOLN: 4.0000 mg | Freq: Four times a day (QID) | INTRAMUSCULAR | Status: DC | PRN

## 2023-08-12 MED ORDER — PHENYLEPHRINE 80 MCG/ML (10ML) SYRINGE FOR IV PUSH (FOR BLOOD PRESSURE SUPPORT): 80.0000 ug | PREFILLED_SYRINGE | INTRAVENOUS | Status: DC | PRN

## 2023-08-12 MED ORDER — HYDRALAZINE HCL 20 MG/ML IJ SOLN: 10.0000 mg | INTRAMUSCULAR | Status: DC | PRN

## 2023-08-12 MED ORDER — OXYTOCIN-SODIUM CHLORIDE 30-0.9 UT/500ML-% IV SOLN
2.5000 [IU]/h | INTRAVENOUS | Status: DC
  Administered 2023-08-13: 2.5 [IU]/h via INTRAVENOUS
  Filled 2023-08-12: qty 500

## 2023-08-12 MED ORDER — TERBUTALINE SULFATE 1 MG/ML IJ SOLN: 0.2500 mg | Freq: Once | INTRAMUSCULAR | Status: DC | PRN

## 2023-08-12 MED ORDER — LABETALOL HCL 5 MG/ML IV SOLN: 40.0000 mg | INTRAVENOUS | Status: DC | PRN

## 2023-08-12 MED ORDER — MISOPROSTOL 50MCG HALF TABLET
50.0000 ug | ORAL_TABLET | Freq: Once | ORAL | Status: AC
  Administered 2023-08-12: 50 ug via ORAL
  Filled 2023-08-12: qty 1

## 2023-08-12 MED ORDER — MOMETASONE FURO-FORMOTEROL FUM 100-5 MCG/ACT IN AERO
2.0000 | INHALATION_SPRAY | Freq: Two times a day (BID) | RESPIRATORY_TRACT | Status: DC
  Filled 2023-08-12: qty 8.8

## 2023-08-12 MED ORDER — OXYTOCIN-SODIUM CHLORIDE 30-0.9 UT/500ML-% IV SOLN
1.0000 m[IU]/min | INTRAVENOUS | Status: DC
  Administered 2023-08-12: 4 m[IU]/min via INTRAVENOUS
  Administered 2023-08-12: 2 m[IU]/min via INTRAVENOUS

## 2023-08-12 MED ORDER — LACTATED RINGERS IV SOLN: 500.0000 mL | Freq: Once | INTRAVENOUS | Status: DC

## 2023-08-12 MED ORDER — MISOPROSTOL 25 MCG QUARTER TABLET
25.0000 ug | ORAL_TABLET | Freq: Once | ORAL | Status: AC
  Administered 2023-08-12: 25 ug via ORAL
  Filled 2023-08-12: qty 1

## 2023-08-12 MED ORDER — MISOPROSTOL 50MCG HALF TABLET: 50.0000 ug | ORAL_TABLET | Freq: Once | ORAL | Status: DC

## 2023-08-12 MED ORDER — DIPHENHYDRAMINE HCL 50 MG/ML IJ SOLN: 12.5000 mg | INTRAMUSCULAR | Status: DC | PRN

## 2023-08-12 MED ORDER — LACTATED RINGERS IV SOLN
500.0000 mL | Freq: Once | INTRAVENOUS | Status: DC
Start: 2023-08-12 — End: 2023-08-13

## 2023-08-12 MED ORDER — OXYTOCIN BOLUS FROM INFUSION
333.0000 mL | Freq: Once | INTRAVENOUS | Status: AC
  Administered 2023-08-13: 333 mL via INTRAVENOUS

## 2023-08-12 MED ORDER — PHENYLEPHRINE 80 MCG/ML (10ML) SYRINGE FOR IV PUSH (FOR BLOOD PRESSURE SUPPORT)
80.0000 ug | PREFILLED_SYRINGE | INTRAVENOUS | Status: DC | PRN
  Filled 2023-08-12: qty 10

## 2023-08-12 MED ORDER — IPRATROPIUM-ALBUTEROL 0.5-2.5 (3) MG/3ML IN SOLN: 3.0000 mL | Freq: Four times a day (QID) | RESPIRATORY_TRACT | Status: DC | PRN

## 2023-08-12 MED ORDER — LABETALOL HCL 5 MG/ML IV SOLN: 20.0000 mg | INTRAVENOUS | Status: DC | PRN

## 2023-08-12 MED ORDER — SOD CITRATE-CITRIC ACID 500-334 MG/5ML PO SOLN: 30.0000 mL | ORAL | Status: DC | PRN

## 2023-08-12 MED ORDER — LABETALOL HCL 5 MG/ML IV SOLN: 80.0000 mg | INTRAVENOUS | Status: DC | PRN

## 2023-08-12 MED ORDER — LIDOCAINE HCL (PF) 1 % IJ SOLN
INTRAMUSCULAR | Status: DC | PRN
  Administered 2023-08-12: 5 mL via EPIDURAL
  Administered 2023-08-12: 2 mL via EPIDURAL
  Administered 2023-08-12: 3 mL via EPIDURAL

## 2023-08-12 MED ORDER — FENTANYL-BUPIVACAINE-NACL 0.5-0.125-0.9 MG/250ML-% EP SOLN
12.0000 mL/h | EPIDURAL | Status: DC | PRN
  Administered 2023-08-12: 12 mL/h via EPIDURAL
  Filled 2023-08-12: qty 250

## 2023-08-12 MED ORDER — LACTATED RINGERS IV SOLN
500.0000 mL | INTRAVENOUS | Status: DC | PRN
Start: 2023-08-12 — End: 2023-08-13

## 2023-08-12 MED ORDER — MISOPROSTOL 25 MCG QUARTER TABLET
25.0000 ug | ORAL_TABLET | Freq: Once | ORAL | Status: AC
  Administered 2023-08-12: 25 ug via VAGINAL
  Filled 2023-08-12: qty 1

## 2023-08-12 MED ORDER — FENTANYL CITRATE (PF) 100 MCG/2ML IJ SOLN
50.0000 ug | INTRAMUSCULAR | Status: DC | PRN
  Administered 2023-08-12: 50 ug via INTRAVENOUS
  Filled 2023-08-12: qty 2

## 2023-08-12 MED ORDER — ACETAMINOPHEN 325 MG PO TABS: 650.0000 mg | ORAL_TABLET | ORAL | Status: DC | PRN

## 2023-08-12 MED ORDER — LIDOCAINE HCL (PF) 1 % IJ SOLN: 30.0000 mL | INTRAMUSCULAR | Status: DC | PRN

## 2023-08-12 NOTE — Progress Notes (Signed)
MATELYN ANTONELLI is a 33 y.o. G2P0010 at [redacted]w[redacted]d.  Subjective: Mild contractions. Foley bulb out.   Objective: BP 134/72   Pulse 99   Temp 98.3 F (36.8 C) (Oral)   Resp 17   Ht 5\' 11"  (1.803 m)   Wt 129.6 kg   LMP 11/23/2022 (Exact Date)   BMI 39.85 kg/m    FHT:  FHR: 145 bpm, variability: mod,  accelerations:  15x15,  decelerations:  none UC:   Q 2-4 minutes, mild Dilation: 4 Effacement (%): 30 Station: -3 Presentation: Vertex Exam by:: c graham scnm AROM mod amount of clear fluid  Labs: Results for orders placed or performed during the hospital encounter of 08/12/23 (from the past 24 hours)  Fibrinogen     Status: Abnormal   Collection Time: 08/12/23  1:17 AM  Result Value Ref Range   Fibrinogen 575 (H) 210 - 475 mg/dL  Protime-INR     Status: None   Collection Time: 08/12/23  1:17 AM  Result Value Ref Range   Prothrombin Time 12.5 11.4 - 15.2 seconds   INR 0.9 0.8 - 1.2  APTT     Status: None   Collection Time: 08/12/23  1:17 AM  Result Value Ref Range   aPTT 24 24 - 36 seconds  CBC     Status: Abnormal   Collection Time: 08/12/23  1:17 AM  Result Value Ref Range   WBC 12.1 (H) 4.0 - 10.5 K/uL   RBC 4.35 3.87 - 5.11 MIL/uL   Hemoglobin 11.3 (L) 12.0 - 15.0 g/dL   HCT 82.9 (L) 56.2 - 13.0 %   MCV 80.7 80.0 - 100.0 fL   MCH 26.0 26.0 - 34.0 pg   MCHC 32.2 30.0 - 36.0 g/dL   RDW 86.5 (H) 78.4 - 69.6 %   Platelets 313 150 - 400 K/uL   nRBC 0.0 0.0 - 0.2 %  Type and screen     Status: None   Collection Time: 08/12/23  1:17 AM  Result Value Ref Range   ABO/RH(D) B POS    Antibody Screen NEG    Sample Expiration      08/15/2023,2359 Performed at [facility], [address] [place], [place] [address]   RPR     Status: None   Collection Time: 08/12/23  1:17 AM  Result Value Ref Range   RPR Ser Ql NON REACTIVE NON REACTIVE  Comprehensive metabolic panel     Status: Abnormal   Collection Time: 08/12/23  1:17 AM  Result Value Ref Range   Sodium 135  135 - 145 mmol/L   Potassium 3.5 3.5 - 5.1 mmol/L   Chloride 104 98 - 111 mmol/L   CO2 19 (L) 22 - 32 mmol/L   Glucose, Bld 86 70 - 99 mg/dL   BUN 7 6 - 20 mg/dL   Creatinine, Ser 4.13 0.44 - 1.00 mg/dL   Calcium 8.9 8.9 - 24.4 mg/dL   Total Protein 6.1 (L) 6.5 - 8.1 g/dL   Albumin 2.7 (L) 3.5 - 5.0 g/dL   AST 16 15 - 41 U/L   ALT 15 0 - 44 U/L   Alkaline Phosphatase 67 38 - 126 U/L   Total Bilirubin 0.4 0.0 - 1.2 mg/dL   GFR, Estimated >01 >02 mL/min   Anion gap 12 5 - 15  Protein / creatinine ratio, urine     Status: None   Collection Time: 08/12/23  1:54 AM  Result Value Ref Range  Creatinine, Urine 210 mg/dL   Total Protein, Urine 24 mg/dL   Protein Creatinine Ratio 0.11 0.00 - 0.15 mg/mg[Cre]    Assessment / Plan: [redacted]w[redacted]d week IUP ROM Labor: Early/IOL, Progressing w/ cytotec (last dose 0645) and foley. Now AROM'd. Will see if contractions increase with AROM. Consider pitocin at 1045 if indicated.  Fetal Wellbeing:  Category I Pain Control:  comfort measures. Plans epidural.  Anticipated MOD:  SVD GHTN: BP, labs stable. No pre-e Sx.   Katrinka Blazing, [place], [place] 08/12/2023 9:50 am

## 2023-08-12 NOTE — Progress Notes (Signed)
Circumcision Consent  Discussed with mom at bedside about circumcision.   Circumcision is a surgery that removes the skin that covers the tip of the penis, called the "foreskin." Circumcision is usually done when a boy is between 30 and 21 days old, sometimes up to 54-57 weeks old.  The most common reasons boys are circumcised include for cultural/religious beliefs or for parental preference (potentially easier to clean, so baby looks like daddy, etc).  There may be some medical benefits for circumcision:   Circumcised boys seem to have slightly lower rates of: ? Urinary tract infections (per the American Academy of Pediatrics an uncircumcised boy has a 1/100 chance of developing a UTI in the first year of life, a circumcised boy at a 07/998 chance of developing a UTI in the first year of life- a 10% reduction) ? Penis cancer (typically rare- an uncircumcised female has a 1 in 100,000 chance of developing cancer of the penis) ? Sexually transmitted infection (in endemic areas, including HIV, HPV and Herpes- circumcision does NOT protect against gonorrhea, chlamydia, trachomatis, or syphilis) ? Phimosis: a condition where that makes retraction of the foreskin over the glans impossible (0.4 per 1000 boys per year or 0.6% of boys are affected by their 15th birthday)  Boys and men who are not circumcised can reduce these extra risks by: ? Cleaning their penis well ? Using condoms during sex  What are the risks of circumcision?  As with any surgical procedure, there are risks and complications. In circumcision, complications are rare and usually minor, the most common being: ? Bleeding- risk is reduced by holding each clamp for 30 seconds prior to a cut being made, and by holding pressure after the procedure is done ? Infection- the penis is cleaned prior to the procedure, and the procedure is done under sterile technique ? Damage to the urethra or amputation of the penis  How is circumcision done  in baby boys?  The baby will be placed on a special table and the legs restrained for their safety. Numbing medication is injected into the penis, and the skin is cleansed with betadine to decrease the risk of infection.   What to expect:  The penis will look red and raw for 5-7 days as it heals. We expect scabbing around where the cut was made, as well as clear-pink fluid and some swelling of the penis right after the procedure. If your baby's circumcision starts to bleed or develops pus, please contact your pediatrician immediately.  All questions were answered and mother consented.  Hessie Dibble Obstetrics Fellow

## 2023-08-12 NOTE — Anesthesia Preprocedure Evaluation (Addendum)
Anesthesia Evaluation  Patient identified by MRN, date of birth, ID band Patient awake    Reviewed: Allergy & Precautions, Patient's Chart, lab work & pertinent test results  Airway Mallampati: II  TM Distance: >3 FB     Dental   Pulmonary asthma    Pulmonary exam normal        Cardiovascular hypertension (CHTN), + DVT (Hx of DVT and PE on lovenox)  Normal cardiovascular exam     Neuro/Psych negative neurological ROS     GI/Hepatic negative GI ROS, Neg liver ROS,,,  Endo/Other  negative endocrine ROS    Renal/GU negative Renal ROS     Musculoskeletal   Abdominal   Peds  Hematology  (+) Blood dyscrasia, anemia   Anesthesia Other Findings   Reproductive/Obstetrics (+) Pregnancy                             Anesthesia Physical Anesthesia Plan  ASA: 3  Anesthesia Plan: Epidural   Post-op Pain Management:    Induction:   PONV Risk Score and Plan: 2 and Treatment may vary due to age or medical condition  Airway Management Planned: Natural Airway  Additional Equipment:   Intra-op Plan:   Post-operative Plan:   Informed Consent: I have reviewed the patients History and Physical, chart, labs and discussed the procedure including the risks, benefits and alternatives for the proposed anesthesia with the patient or authorized representative who has indicated his/her understanding and acceptance.       Plan Discussed with:   Anesthesia Plan Comments:        Anesthesia Quick Evaluation

## 2023-08-12 NOTE — Progress Notes (Signed)
Patient ID: Marissa Hansen, female   DOB: [birth year], 33 y.o.   MRN: [id number] Vitals:   08/12/23 1930 08/12/23 2000 08/12/23 2105 08/12/23 2130  BP: 122/64 (!) 121/95 133/62 (!) 121/50  Pulse: 99 (!) 102 92 96  Resp: 18 17 18 18   Temp:      TempSrc:      SpO2:      Weight:      Height:       FHR 140s with accels to 150s One decel to 80-90s while turning patient Pitocin off for now  Dilation: 5 Effacement (%): 90 Station: -1 Presentation: Vertex (confirmed by ultrasound) Exam by:: stone rnc  IUPC inserted with consent from patient Will restart Pitocin after fetal rest

## 2023-08-12 NOTE — Progress Notes (Signed)
Marissa Hansen is a 33 y.o. G2P0010 at [redacted]w[redacted]d.  Subjective: Comfortable with epidural.  Objective: BP 139/64   Pulse 96   Temp 98.2 F (36.8 C) (Oral)   Resp 17   Ht 5\' 11"  (1.803 m)   Wt 129.6 kg   LMP 11/23/2022 (Exact Date)   SpO2 100%   BMI 39.85 kg/m    FHT:  FHR: 140 bpm, variability: mod,  accelerations:  15x15,  decelerations:  none UC:   Q 2-4 minutes, mod-strong Dilation: 4.5 Effacement (%): 70 Station: -3 Presentation: Vertex Exam by:: Veva Grimley cnm  Labs: NA  Assessment / Plan: [redacted]w[redacted]d week IUP, FGR ROM x Hours: 9 Minutes: 20 Labor: Early, progressing well on pitocin.  Fetal Wellbeing:  Category I Pain Control:  Epidural Anticipated MOD:  SVD GHTN: Stable BP and labs   [place], [place], [place] 08/12/2023 7:10 PM

## 2023-08-12 NOTE — Progress Notes (Addendum)
Marissa Hansen is a 33 y.o. G2P0010 at [redacted]w[redacted]d IOL for gHTN and IUGR.  Subjective: Patient resting in bed comfortably. She perceives contractions about every 10 minutes and rates the pain 5/10. Denies any HA, visual changes or epigastric pain. Objective: BP 134/72   Pulse 99   Temp 98.3 F (36.8 C) (Oral)   Resp 17   Ht 5\' 11"  (1.803 m)   Wt 129.6 kg   LMP 11/23/2022 (Exact Date)   BMI 39.85 kg/m     FHT:  FHR: 145 bpm, variability: moderate,  accelerations:  Present,  decelerations:  Absent UC:   irregular, every 10 minutes SVE:   Dilation: 4 Effacement (%): 30 Station: -3 Exam by:: c Berdia Lachman scnm  Labs: Lab Results  Component Value Date   WBC 12.1 (H) 08/12/2023   HGB 11.3 (L) 08/12/2023   HCT 35.1 (L) 08/12/2023   MCV 80.7 08/12/2023   PLT 313 08/12/2023    Assessment / Plan: Induction of labor due to gestational hypertension and IUGR. Cat I FHT.  Labor:  [place] to re-confirm vertex , start low dose pitocin 2x2. gHTN: Bps stable. Fetal Wellbeing:  Category I Pain Control:  Comfort measures and epidural upon request. I/D:  GBS neg Anticipated MOD:  NSVD  Elige Ko, Student-MidWife 08/12/2023, 12:44 PM

## 2023-08-12 NOTE — Progress Notes (Signed)
Marissa Hansen is a 33 y.o. G2P0010 at [redacted]w[redacted]d.  Subjective: Increased discomfort w/ contractions.   Objective: BP (!) 135/57   Pulse 91   Temp 98.2 F (36.8 C) (Oral)   Resp 18   Ht 5\' 11"  (1.803 m)   Wt 129.6 kg   LMP 11/23/2022 (Exact Date)   BMI 39.85 kg/m    FHT:  FHR: 145 bpm, variability: mod,  accelerations:  15x15,  decelerations:  none UC:   Q 2-6 minutes, mild-mod Dilation: 4.5 Effacement (%): 70 Station: -3 Presentation: Vertex Exam by:: Mehar Kirkwood cnm  Labs:  Collection Time: 08/12/23  1:06 PM  Result Value Ref Range   WBC 11.7 (H) 4.0 - 10.5 K/uL   RBC 4.32 3.87 - 5.11 MIL/uL   Hemoglobin 11.3 (L) 12.0 - 15.0 g/dL   HCT 16.1 (L) 09.6 - 04.5 %   MCV 80.3 80.0 - 100.0 fL   MCH 26.2 26.0 - 34.0 pg   MCHC 32.6 30.0 - 36.0 g/dL   RDW 40.9 (H) 81.1 - 91.4 %   Platelets 323 150 - 400 K/uL   nRBC 0.0 0.0 - 0.2 %    Assessment / Plan: [redacted]w[redacted]d week IUP, FGR ROM x Hours: 5 Minutes: 54 Labor: Early, small amount of progress on pitocin. Just starting to get more uncomfortable w/ contractions. Titrate pitocin to achieve adequate labor.  Fetal Wellbeing:  Category I Pain Control:  Comfort measures Anticipated MOD:  SVD GHTN: Stable BP and labs  [place], [place], [place] 08/12/2023 3:44 PM

## 2023-08-12 NOTE — Anesthesia Procedure Notes (Signed)
Epidural Patient location during procedure: OB Start time: 08/12/2023 5:33 PM End time: 08/12/2023 5:43 PM  Staffing Anesthesiologist: Marcene Duos, MD Performed: anesthesiologist   Preanesthetic Checklist Completed: patient identified, IV checked, site marked, risks and benefits discussed, surgical consent, monitors and equipment checked, pre-op evaluation and timeout performed  Epidural Patient position: sitting Prep: DuraPrep and site prepped and draped Patient monitoring: continuous pulse ox and blood pressure Approach: midline Location: L3-L4 Injection technique: LOR air  Needle:  Needle type: Tuohy  Needle gauge: 17 G Needle length: 9 cm and 9 Needle insertion depth: 9 cm Catheter type: closed end flexible Catheter size: 19 Gauge Catheter at skin depth: 17 cm Test dose: negative  Assessment Events: blood not aspirated, no cerebrospinal fluid, injection not painful, no injection resistance, no paresthesia and negative IV test

## 2023-08-12 NOTE — Progress Notes (Signed)
Patient ID: Marissa Hansen, female   DOB: [birth year], 33 y.o.   MRN: [id number] Pt informed that the ultrasound is considered a limited OB ultrasound and is not intended to be a complete ultrasound exam.  Patient also informed that the ultrasound is not being completed with the intent of assessing for fetal or placental anomalies or any pelvic abnormalities.  Explained that the purpose of today's ultrasound is to assess for presentation, BPP and amniotic fluid volume.  Patient acknowledges the purpose of the exam and the limitations of the study.    Vertex presentation

## 2023-08-12 NOTE — H&P (Addendum)
OBSTETRIC ADMISSION HISTORY AND PHYSICAL  Marissa Hansen is a 33 y.o. female G2P0010 with IUP at [redacted]w[redacted]d by [place] presenting for IOL due to cHTN and IUGR (AC 9th %ile). She reports +FMs, No LOF, no VB, no blurry vision, headaches or peripheral edema, and RUQ pain.  She plans on breastfeeding. She is undecided for birth control, thinking about Paraguard given hx of DVT and PE. 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 Patient had a thrombophilia evaluation that was negative.   Dysrhythmia    PVC's   Hypertension    Insulin resistance    Missed abortion    Palpitations 10/2018   LOV w/ cardiology, 08/14/20 Dr. Rosemary Holms in Epic. 01/25/21 Echo in Epic, EF 61%.   PCOS (polycystic ovarian syndrome)    PE (pulmonary thromboembolism) (HCC) 2020   bilateral   Plantar fasciitis 06/13/2021   left foot   Polycystic ovarian syndrome 10/01/2018   Past Surgical History: Past Surgical History:  Procedure Laterality Date   DILATION AND EVACUATION N/A 12/05/2021   Procedure: DILATATION AND EVACUATION;  Surgeon: Jerene Bears, MD;  Location:  [facility];  Service: Gynecology;  Laterality: N/A;   WISDOM TOOTH EXTRACTION     around 2005    Obstetrical History: OB History     Gravida  2   Para      Term      Preterm      AB  1   Living         SAB  1   IAB      Ectopic      Multiple      Live Births             Social History Social History   Socioeconomic History   Marital status: Single    Spouse name: Not on file   Number of children: Not on file   Years of education: Not on file   Highest education level: Not on file  Occupational History   Not on file  Tobacco Use   Smoking status: Never   Smokeless tobacco: Never  Vaping Use   Vaping status: Never Used  Substance and Sexual Activity   Alcohol use: Not Currently    Comment: very rarely   Drug use: No   Sexual activity: Yes    Birth control/protection: None  Other Topics Concern   Not on file  Social History Narrative   Not on file   Social Drivers of Health   Financial Resource Strain: Low Risk  (01/22/2023)   Overall Financial Resource Strain (CARDIA)    Difficulty of Paying Living Expenses: Not hard at all  Food Insecurity: No Food Insecurity (  08/12/2023)   Hunger Vital Sign    Worried About Running Out of Food in the Last Year: Never true    Ran Out of Food in the Last Year: Never true  Transportation Needs: No Transportation Needs (08/12/2023)   PRAPARE - Administrator, Civil Service (Medical): No    Lack of Transportation (Non-Medical): No  Physical Activity: Insufficiently Active (01/22/2023)   Exercise Vital Sign    Days of Exercise per Week: 3 days    Minutes of Exercise per Session: 30 min  Stress: Stress Concern Present (01/22/2023)   [employer] of Occupational Health - Occupational Stress Questionnaire    Feeling of Stress : Rather much  Social Connections: Socially Integrated (08/12/2023)   Social Connection and Isolation Panel [NHANES]    Frequency of Communication with Friends and Family: Three times a week    Frequency of Social Gatherings with Friends and Family: Once a week     Attends Religious Services: More than 4 times per year    Active Member of [employer] or Organizations: Yes    Attends Banker Meetings: 1 to 4 times per year    Marital Status: Living with partner   Family History: Family History  Adopted: Yes  Problem Relation Age of Onset   Hypertension Mother    Atrial fibrillation Father    Pulmonary embolism Sister    Diabetes Maternal Aunt    Asthma Neg Hx    Cancer Neg Hx    Heart disease Neg Hx    Allergies: No Known Allergies  Medications Prior to Admission  Medication Sig Dispense Refill Last Dose/Taking   aspirin EC 81 MG tablet Take 1 tablet (81 mg total) by mouth daily. Swallow whole.   08/08/2023 Noon   Blood Glucose Monitoring Suppl (FREESTYLE LITE) w/Device KIT Dispense 1 device to check fasting blood sugars and 2hr pp blood sugars 1 kit 0 Past Month   enoxaparin (LOVENOX) 60 MG/0.6ML injection Inject 0.6 mLs (60 mg total) into the skin every 12 (twelve) hours. 36 mL 1 08/11/2023 Morning   ferrous sulfate 325 (65 FE) MG tablet Take 1 tablet (325 mg total) by mouth daily with breakfast.   08/11/2023 at 11:00 PM   Prenatal Vit-Fe Fumarate-FA (MULTIVITAMIN-PRENATAL) 27-0.8 MG TABS tablet Take 1 tablet by mouth daily at 12 noon.   08/11/2023 at 11:00 PM   valACYclovir (VALTREX) 500 MG tablet Take 1 tablet (500 mg total) by mouth 2 (two) times daily. 60 tablet 1 08/11/2023 at 11:00 PM   budesonide-formoterol (SYMBICORT) 80-4.5 MCG/ACT inhaler Inhale 2 puffs into the lungs 2 (two) times daily. 1 each 7 More than a month   glucose blood (FREESTYLE LITE) test strip Use as instructed (Patient not taking: Reported on 06/24/2023) 100 each 12 More than a month   ipratropium-albuterol (DUONEB) 0.5-2.5 (3) MG/3ML SOLN Take 3 mLs by nebulization every 6 (six) hours as needed (Wheezing, shortness of breath). (Patient not taking: Reported on 06/24/2023) 360 mL 3 More than a month   Lancets (FREESTYLE) lancets Use as instructed (Patient not taking:  Reported on 06/24/2023) 100 each 12 More than a month   Review of Systems   All systems reviewed and negative except as stated in HPI  Blood pressure 115/61, pulse 91, temperature 97.6 F (36.4 C), temperature source Axillary, resp. rate 18, height 5\' 11"  (1.803 m), weight 129.6 kg, last menstrual period 11/23/2022, unknown if currently breastfeeding. General appearance: alert, cooperative, appears stated age, and no distress Lungs:  clear to auscultation bilaterally Heart: regular rate and rhythm Abdomen: soft, non-tender; bowel sounds normal Extremities: no unilateral lower extremity edema, warmth, erythema, or tenderness Presentation: cephalic Fetal monitoring Baseline: 140 bpm, Variability: Fair (1-6 bpm), Accelerations: Reactive, and Decelerations: Absent Uterine activity None Dilation: Closed Effacement (%): 50 Station: -3 Exam by:: Judd Lien, MD Prenatal labs: ABO, Rh: --/--/B POS (01/28 0117) Antibody: NEG (01/28 0117) Rubella: 2.51 (07/26 0930) RPR: Non Reactive (11/22 0820)  HBsAg: Negative (07/26 0930)  HIV: Non Reactive (11/22 0820)  GBS: Negative/-- (01/15 1149)    Lab Results  Component Value Date   GBS Negative 07/30/2023   GTT wnl Genetic screening negative Anatomy US normal  Immunization History  Administered Date(s) Administered   Dtap, Unspecified 12/18/1988, 03/03/1989, 05/01/1989, 04/27/1990, 04/02/1994   HIB, Unspecified 08/27/1989, 04/27/1990   HPV 9-valent 09/20/2014   Hep B, Unspecified 04/10/2000, 05/15/2000, 10/23/2000   Influenza Inj Mdck Quad Pf 04/10/2022   Influenza,inj,Quad PF,6+ Mos 05/08/2021   Influenza,inj,quad, With Preservative 09/21/2018, 06/14/2020   Influenza-Unspecified 09/21/2018   MMR 04/27/1990, 04/02/1994   PFIZER Comirnaty(Gray Top)Covid-19 Tri-Sucrose Vaccine 10/01/2019, 10/22/2019, 06/14/2020   Polio, Unspecified 12/18/1988, 03/03/1989, 04/27/1990, 04/02/1994   Rsv, Bivalent, Protein Subunit Rsvpref,pf Verdis Frederickson) 07/18/2023    Td 04/07/2001   Tdap 08/14/2021, 06/06/2023   Prenatal Transfer Tool  Maternal Diabetes: No Genetic Screening: Normal Maternal Ultrasounds/Referrals: IUGR Fetal Ultrasounds or other Referrals:  Referred to Materal Fetal Medicine  Maternal Substance Abuse:  No Significant Maternal Medications:  None Significant Maternal Lab Results: Group B Strep negative Number of Prenatal Visits:greater than 3 verified prenatal visits Maternal Vaccinations:RSV: Given during pregnancy >/=14 days ago and TDap  Results for orders placed or performed during the hospital encounter of 08/12/23 (from the past 24 hours)  Fibrinogen   Collection Time: 08/12/23  1:17 AM  Result Value Ref Range   Fibrinogen 575 (H) 210 - 475 mg/dL  Protime-INR   Collection Time: 08/12/23  1:17 AM  Result Value Ref Range   Prothrombin Time 12.5 11.4 - 15.2 seconds   INR 0.9 0.8 - 1.2  APTT   Collection Time: 08/12/23  1:17 AM  Result Value Ref Range   aPTT 24 24 - 36 seconds  CBC   Collection Time: 08/12/23  1:17 AM  Result Value Ref Range   WBC 12.1 (H) 4.0 - 10.5 K/uL   RBC 4.35 3.87 - 5.11 MIL/uL   Hemoglobin 11.3 (L) 12.0 - 15.0 g/dL   HCT 08.6 (L) 57.8 - 46.9 %   MCV 80.7 80.0 - 100.0 fL   MCH 26.0 26.0 - 34.0 pg   MCHC 32.2 30.0 - 36.0 g/dL   RDW 62.9 (H) 52.8 - 41.3 %   Platelets 313 150 - 400 K/uL   nRBC 0.0 0.0 - 0.2 %  Comprehensive metabolic panel   Collection Time: 08/12/23  1:17 AM  Result Value Ref Range   Sodium 135 135 - 145 mmol/L   Potassium 3.5 3.5 - 5.1 mmol/L   Chloride 104 98 - 111 mmol/L   CO2 19 (L) 22 - 32 mmol/L   Glucose, Bld 86 70 - 99 mg/dL   BUN 7 6 - 20 mg/dL   Creatinine, Ser 2.44 0.44 - 1.00 mg/dL   Calcium 8.9 8.9 - 01.0 mg/dL   Total Protein 6.1 (L) 6.5 - 8.1 g/dL   Albumin 2.7 (L) 3.5 - 5.0 g/dL   AST 16 15 - 41 U/L   ALT 15 0 - 44 U/L   Alkaline Phosphatase  67 38 - 126 U/L   Total Bilirubin 0.4 0.0 - 1.2 mg/dL   GFR, Estimated >09 >81 mL/min   Anion gap 12 5 - 15   Type and screen   Collection Time: 08/12/23  1:17 AM  Result Value Ref Range   ABO/RH(D) B POS    Antibody Screen NEG    Sample Expiration      08/15/2023,2359 Performed at [facility], [address], [place], [place] [address]   Protein / creatinine ratio, urine   Collection Time: 08/12/23  1:54 AM  Result Value Ref Range   Creatinine, Urine 210 mg/dL   Total Protein, Urine 24 mg/dL   Protein Creatinine Ratio 0.11 0.00 - 0.15 mg/mg[Cre]    Patient Active Problem List   Diagnosis Date Noted   Uterine contractions during pregnancy 08/12/2023   Anemia affecting pregnancy in third trimester 07/04/2023   IUGR (intrauterine growth restriction) affecting care of mother 06/04/2023   Asthma with acute exacerbation 05/19/2023   Encounter for maternal care for suspected poor fetal growth in singleton pregnancy in second trimester 05/15/2023   Psychosocial stressors-resolved 04/17/2023   History of miscarriage 04/17/2023   Obesity affecting pregnancy, antepartum 01/29/2023   Supervision of high risk pregnancy, antepartum 01/23/2023   History of deep vein thrombosis (DVT) of lower extremity, On Lovenox BID 09/23/2019   HSV-2 seropositive; Start Valtrex at 36 weeks 03/16/2019   Personal history of PE (pulmonary embolism) 12/21/2018    Assessment/Plan:  DARRIELLE PFLIEGER is a 33 y.o. G2P0010 at [redacted]w[redacted]d here for IOL due to IUGR and cHTN.  #Labor: closed/50%/-3, start dual Cytotec #Pain: Well-controlled on admission, epidural available on request #FWB: Cat 1 #GBS status: negative #Feeding: Breastmilk  #Reproductive Life planning: Undecided, considering Paraguard #Circ: female yes  #IUGR: based on AC at 9th %ile on MFM [place] at [redacted]w[redacted]d, reassuringly BPP 8/8, AFI 13.97 cm, and normal Doppler studies  #cHTN: last ASA 81 mg dose on 1/24 evening, BP 144/90 on admission without red flag symptoms for pre-eclampsia/eclampsia, UPCR 0.11, plt 313K, K 3.5, plan to closely monitor BP and new onset  of red flag symptoms  #hx RLE DVT and bilateral PE: most likely provoked based on testing results (see detailed note 7/23), last Lovenox 60 mg dose on 1/26 at 2300, no unilateral lower extremity edema, warmth, erythema, or tenderness, dyspnea, or chest pain on admission, PT/INR and aPTT wnl and fibrinogen 575 on admission, plan to restart anticoagulation postpartum   #IDA: s/p oral iron supplements, Hgb on admission 11.3  #HSV-2 seropositive: started Valtrex 500 mg BID at 36 weeks, last dose on 1/28 evening, no prodromal symptoms or active outbreaks on admission, continue Valtrex during this admission  #Asthma: ordered Dulera for home Symbicort, ordered PRN duonebs Q6hr  Estrella Deeds, Medical Student  08/12/2023, 4:23 AM  Evaluation and management procedures were performed by the MS3 under my supervision. I was immediately available for direct supervision, assistance and direction throughout this encounter.  I also confirm that I have verified the information documented in the student's note, and that I have also personally reperformed the pertinent components of the physical exam and all of the medical decision making activities.  I have also made any necessary editorial changes.   Mittie Bodo, MD Family Medicine - Obstetrics Fellow

## 2023-08-12 NOTE — Progress Notes (Signed)
Patient ID: Marissa Hansen, female   DOB: 1990/08/19, 33 y.o.   MRN: [id number] Doing well, comfortable  FHR 135 baseline  Average variability + accelerations No decelerations  Cervix exam deferred  Continue plan of care

## 2023-08-12 NOTE — Progress Notes (Signed)
Labor Progress Note  Marissa Hansen is a 33 y.o. G2P0010 at [redacted]w[redacted]d presented for IOL cHTN, IUGR (AC 9%).   S: resting comfortably in bed, no concerns, agreeable to check  O:  BP (!) 140/77   Pulse 96   Temp 98.4 F (36.9 C) (Oral)   Resp 18   Ht 5\' 11"  (1.803 m)   Wt 129.6 kg   LMP 11/23/2022 (Exact Date)   BMI 39.85 kg/m  EFM:140bpm/Moderate variability/ 15x15 accels/ None decels CAT: 1 Toco: regular, every 4 minutes   CVE: Dilation: 1 Effacement (%): 30 Station: -3 Presentation: Vertex (Confirmed by [place] by Wynelle Bourgeois, CNM) Exam by:: Judd Lien, MD   A&P: 33 y.o. G2P0010 [redacted]w[redacted]d  here for IOL as above  #Labor: Progressing well. Tolerated placement of FB 40cc.  Will plan for buccal cytotec as soon as able  #Pain: per patient request #FWB: CAT 1 #GBS negative  #IUGR: based on AC at 9th %ile on MFM [place] at [redacted]w[redacted]d, reassuringly BPP 8/8, AFI 13.97 cm, and normal Doppler studies   #cHTN: last ASA 81 mg dose on 1/24 evening, BP 144/90 on admission without red flag symptoms for pre-eclampsia/eclampsia, UPCR 0.11, plt 313K, K 3.5, plan to closely monitor BP and new onset of red flag symptoms   #hx RLE DVT and bilateral PE: most likely provoked based on testing results (see detailed note 7/23), last Lovenox 60 mg dose on 1/26 at 2300, no unilateral lower extremity edema, warmth, erythema, or tenderness, dyspnea, or chest pain on admission, PT/INR and aPTT wnl and fibrinogen 575 on admission, plan to restart anticoagulation postpartum    #IDA: s/p oral iron supplements, Hgb on admission 11.3   #HSV-2 seropositive: started Valtrex 500 mg BID at 36 weeks, last dose on 1/28 evening, no prodromal symptoms or active outbreaks on admission, continue Valtrex during this admission   #Asthma: ordered Dulera for home Symbicort, ordered PRN duonebs Q6hr  Hessie Dibble, MD FMOB Fellow, Faculty practice [facility], [facility] 08/12/23  6:34 AM

## 2023-08-13 ENCOUNTER — Encounter (HOSPITAL_COMMUNITY): Payer: Self-pay | Admitting: Obstetrics and Gynecology

## 2023-08-13 DIAGNOSIS — Z3A37 37 weeks gestation of pregnancy: Secondary | ICD-10-CM

## 2023-08-13 DIAGNOSIS — O99214 Obesity complicating childbirth: Secondary | ICD-10-CM

## 2023-08-13 DIAGNOSIS — O134 Gestational [pregnancy-induced] hypertension without significant proteinuria, complicating childbirth: Secondary | ICD-10-CM

## 2023-08-13 DIAGNOSIS — O365930 Maternal care for other known or suspected poor fetal growth, third trimester, not applicable or unspecified: Secondary | ICD-10-CM

## 2023-08-13 LAB — CBC
HCT: 29.8 % — ABNORMAL LOW (ref 36.0–46.0)
Hemoglobin: 9.8 g/dL — ABNORMAL LOW (ref 12.0–15.0)
MCH: 26.6 pg (ref 26.0–34.0)
MCHC: 32.9 g/dL (ref 30.0–36.0)
MCV: 80.8 fL (ref 80.0–100.0)
Platelets: 289 10*3/uL (ref 150–400)
RBC: 3.69 MIL/uL — ABNORMAL LOW (ref 3.87–5.11)
RDW: 18.4 % — ABNORMAL HIGH (ref 11.5–15.5)
WBC: 19.6 10*3/uL — ABNORMAL HIGH (ref 4.0–10.5)
nRBC: 0 % (ref 0.0–0.2)

## 2023-08-13 MED ORDER — ZOLPIDEM TARTRATE 5 MG PO TABS: 5.0000 mg | ORAL_TABLET | Freq: Every evening | ORAL | Status: DC | PRN

## 2023-08-13 MED ORDER — ENOXAPARIN SODIUM 60 MG/0.6ML IJ SOSY: 60.0000 mg | PREFILLED_SYRINGE | Freq: Two times a day (BID) | INTRAMUSCULAR | Status: DC

## 2023-08-13 MED ORDER — BENZOCAINE-MENTHOL 20-0.5 % EX AERO
1.0000 | INHALATION_SPRAY | CUTANEOUS | Status: DC | PRN
  Administered 2023-08-13: 1 via TOPICAL
  Filled 2023-08-13: qty 56

## 2023-08-13 MED ORDER — TRANEXAMIC ACID-NACL 1000-0.7 MG/100ML-% IV SOLN
1000.0000 mg | INTRAVENOUS | Status: AC
  Administered 2023-08-13: 1000 mg via INTRAVENOUS

## 2023-08-13 MED ORDER — ONDANSETRON HCL 4 MG PO TABS
4.0000 mg | ORAL_TABLET | ORAL | Status: DC | PRN
Start: 2023-08-13 — End: 2023-08-15

## 2023-08-13 MED ORDER — SIMETHICONE 80 MG PO CHEW: 80.0000 mg | CHEWABLE_TABLET | ORAL | Status: DC | PRN

## 2023-08-13 MED ORDER — COCONUT OIL OIL: 1.0000 | TOPICAL_OIL | Status: DC | PRN

## 2023-08-13 MED ORDER — DIPHENHYDRAMINE HCL 25 MG PO CAPS: 25.0000 mg | ORAL_CAPSULE | Freq: Four times a day (QID) | ORAL | Status: DC | PRN

## 2023-08-13 MED ORDER — ONDANSETRON HCL 4 MG/2ML IJ SOLN: 4.0000 mg | INTRAMUSCULAR | Status: DC | PRN

## 2023-08-13 MED ORDER — PRENATAL MULTIVITAMIN CH
1.0000 | ORAL_TABLET | Freq: Every day | ORAL | Status: DC
  Administered 2023-08-13 – 2023-08-15 (×3): 1 via ORAL
  Filled 2023-08-13 (×3): qty 1

## 2023-08-13 MED ORDER — TRANEXAMIC ACID-NACL 1000-0.7 MG/100ML-% IV SOLN
INTRAVENOUS | Status: AC
  Filled 2023-08-13: qty 100

## 2023-08-13 MED ORDER — IBUPROFEN 600 MG PO TABS
600.0000 mg | ORAL_TABLET | Freq: Four times a day (QID) | ORAL | Status: DC
  Administered 2023-08-13 – 2023-08-15 (×9): 600 mg via ORAL
  Filled 2023-08-13 (×9): qty 1

## 2023-08-13 MED ORDER — SENNOSIDES-DOCUSATE SODIUM 8.6-50 MG PO TABS
2.0000 | ORAL_TABLET | ORAL | Status: DC
  Administered 2023-08-13 – 2023-08-15 (×3): 2 via ORAL
  Filled 2023-08-13 (×3): qty 2

## 2023-08-13 MED ORDER — ENOXAPARIN SODIUM 60 MG/0.6ML IJ SOSY
60.0000 mg | PREFILLED_SYRINGE | Freq: Two times a day (BID) | INTRAMUSCULAR | Status: DC
  Administered 2023-08-13 – 2023-08-15 (×4): 60 mg via SUBCUTANEOUS
  Filled 2023-08-13 (×4): qty 0.6

## 2023-08-13 MED ORDER — ACETAMINOPHEN 325 MG PO TABS: 650.0000 mg | ORAL_TABLET | ORAL | Status: DC | PRN

## 2023-08-13 MED ORDER — DIBUCAINE (PERIANAL) 1 % EX OINT: 1.0000 | TOPICAL_OINTMENT | CUTANEOUS | Status: DC | PRN

## 2023-08-13 MED ORDER — TETANUS-DIPHTH-ACELL PERTUSSIS 5-2.5-18.5 LF-MCG/0.5 IM SUSY: 0.5000 mL | PREFILLED_SYRINGE | Freq: Once | INTRAMUSCULAR | Status: DC

## 2023-08-13 MED ORDER — WITCH HAZEL-GLYCERIN EX PADS: 1.0000 | MEDICATED_PAD | CUTANEOUS | Status: DC | PRN

## 2023-08-13 NOTE — Lactation Note (Signed)
This note was copied from a baby's chart. Lactation Consultation Note  Patient Name: Marissa Hansen [id number]'[id number] Date: 08/13/2023 Age:35 hours Reason for consult: Follow-up assessment;Early term 37-38.6wks;Primapara;1st time breastfeeding  P1, 37 wks, @ 7 hrs of life. LC rounded to see if parents had woken baby/ latched baby @ 3 hr interval- as LC had encouraged with last feeding completion. Infant on breast- praised mom! Mom using cross-cradle on left breast. Latch check- highlighted good things, demonstrated breaking latch and re-latching when baby gets to a small mouth latch. Reset baby a couple times to demonstrate to mom. Infant learning well- getting good big mouth response @ smelling nipple- does tend to migrate into smaller mouth latch- reassured mom baby is learning. Feeding continues with LC exit, praised couplet learning to work together.   Maternal Data Has patient been taught Hand Expression?: Yes Does the patient have breastfeeding experience prior to this delivery?: No  Feeding Mother's Current Feeding Choice: Breast Milk  LATCH Score Latch: Grasps breast easily, tongue down, lips flanged, rhythmical sucking.  Audible Swallowing: Spontaneous and intermittent  Type of Nipple: Everted at rest and after stimulation  Comfort (Breast/Nipple): Soft / non-tender  Hold (Positioning): Assistance needed to correctly position infant at breast and maintain latch.  LATCH Score: 9   Interventions Interventions: Breast feeding basics reviewed;Assisted with latch;Hand express;Breast compression;Education  Discharge Pump: Personal (Mom has pump)  Consult Status Consult Status: Follow-up Date: 08/14/23 Follow-up type: In-patient    [facility] 08/13/2023, 1:25 PM

## 2023-08-13 NOTE — Progress Notes (Signed)
Patient logged into babyscripts once but does not have the app on her phone. Patient given instruction about reloading the app and calling/emailing support for any trouble. Unable to re recruit in orders.

## 2023-08-13 NOTE — Lactation Note (Signed)
This note was copied from a baby's chart. Lactation Consultation Note  Patient Name: Marissa Hansen [id number] Date: 08/13/2023 Age:35 hours Reason for consult: Initial assessment;Difficult latch;1st time breastfeeding;Primapara;Early term 37-38.6wks  P1, 37 wks @ 4 hrs of life. Discussed baby readiness @ breast progression. Day 1 sleepy, day 2 more awake/feeding cues, and overnight cluster feeding to bring in milk supply. Encouraged mom shorter duration feeds -10 minutes- can be very normal first day, with longer feeds- 30 minutes- on go home day. Demonstrated steps of latching baby, with encouragement baby does respond well- latches, suckling in bursts. Hand expression does show colostrum once baby has worked on breast a couple minutes- encouraged starting feeds with hand expression. Infant works 10 minutes, with LC holding through feed. LC services and milk storage handouts shared with mom.   Maternal Data Has patient been taught Hand Expression?: Yes Does the patient have breastfeeding experience prior to this delivery?: No  Feeding Mother's Current Feeding Choice: Breast Milk  LATCH Score Latch: Grasps breast easily, tongue down, lips flanged, rhythmical sucking.  Audible Swallowing: Spontaneous and intermittent  Type of Nipple: Everted at rest and after stimulation  Comfort (Breast/Nipple): Soft / non-tender  Hold (Positioning): Full assist, staff holds infant at breast  LATCH Score: 8  Discharge Pump: Personal;DEBP (Per mom has pump - [employer]ra)  Consult Status Consult Status: Follow-up Date: 08/13/23 Follow-up type: In-patient    [facility] 08/13/2023, 10:27 AM

## 2023-08-13 NOTE — Progress Notes (Signed)
Patient ID: Marissa Hansen, female   DOB: 04-01-1991, 33 y.o.   MRN: [id number] Fetal heart rate reactive  No decels noted Average variability  UCs irreguar Pitocin had been restarted at 2 and is now up to 5mu/min MVUs are 160  Will continue to increase Pitocin.

## 2023-08-13 NOTE — Progress Notes (Signed)
Asked patient if she has downloaded baby script app to try to sign and she stated not yet.

## 2023-08-13 NOTE — Lactation Note (Signed)
This note was copied from a baby's chart. Lactation Consultation Note  Patient Name: Marissa Hansen [id number] Date: 08/13/2023 Age:35 hours Reason for consult: Follow-up assessment;Primapara;1st time breastfeeding;Early term 37-38.6wks;Maternal endocrine disorder  P1- MOB reports that infant is nursing well overall, but is a little fussy at times. LC noted that infant was gaggy when laying in his crib. LC reviewed how this may be the reason he is a little fussy at times. MOB reports that the previous LC taught her how to hand express and colostrum was noted. LC sized MOB at a 17/18 mm flange for her pump. LC encouraged MOB to call for further assistance as needed.  Maternal Data Has patient been taught Hand Expression?: Yes Does the patient have breastfeeding experience prior to this delivery?: No  Feeding Mother's Current Feeding Choice: Breast Milk  Lactation Tools Discussed/Used Pump Education: Milk Storage  Interventions Interventions: Breast feeding basics reviewed;Education;LC Services brochure  Discharge Discharge Education: Warning signs for feeding baby Pump: DEBP;Personal  Consult Status Consult Status: Follow-up Date: 08/14/23 Follow-up type: In-patient    Dema Severin BS, IBCLC 08/13/2023, 5:22 PM

## 2023-08-13 NOTE — Lactation Note (Signed)
This note was copied from a baby's chart. Lactation Consultation Note  Patient Name: Marissa Hansen ZOXWR'U Date: 08/13/2023 Age:35 hours Reason for consult: Follow-up assessment;Mother's request;Primapara;1st time breastfeeding;Early term 37-38.6wks;Maternal endocrine disorder;Breastfeeding assistance  P1- MOB requested for LC to assess a latch to ensure everything was correct (per MOB). MOB placed infant on the left breast in the cross cradle hold. Infant was alert and latched immediately. MOB needed no assistance from [facility] to latch infant. LC praised both MOB and infant. Infant would suck, then gag, then suck again and stop. This went on for about 15 minutes. LC reassured MOB again that this gagging is normal due to the fluid in his belly. MOB requested assistance with hand expression. LC and MOB were able to hand express 3 drops of colostrum after a few minutes of expressing. The EBM was finger fed to infant.  MOB asked if she needed to start supplementing infant at this time. LC reassured MOB again that everything looks normal so far and she doesn't have to supplement unless she is wanting to. MOB decided to hold off on supplementation, but requested a manual pump for further stimulation. LC provided the manual pump with size 18 mm flanges. LC encouraged MOB to call for further assistance as needed.  Maternal Data Has patient been taught Hand Expression?: Yes Does the patient have breastfeeding experience prior to this delivery?: No  Feeding Mother's Current Feeding Choice: Breast Milk  LATCH Score Latch: Repeated attempts needed to sustain latch, nipple held in mouth throughout feeding, stimulation needed to elicit sucking reflex.  Audible Swallowing: None  Type of Nipple: Everted at rest and after stimulation  Comfort (Breast/Nipple): Soft / non-tender  Hold (Positioning): No assistance needed to correctly position infant at breast.  LATCH Score: 7   Lactation Tools  Discussed/Used Tools: Pump;Flanges Flange Size: 18 Breast pump type: Manual Pump Education: Setup, frequency, and cleaning;Milk Storage Reason for Pumping: MOB request Pumping frequency: 15-20 min every 3 hrs as needed  Interventions Interventions: Breast feeding basics reviewed;Assisted with latch;Hand express;Breast compression;Adjust position;Support pillows;Position options;Expressed milk;Hand pump;Education;LC Services brochure  Discharge Discharge Education: Warning signs for feeding baby Pump: DEBP;Personal  Consult Status Consult Status: Follow-up Date: 08/14/23 Follow-up type: In-patient    Dema Severin BS, IBCLC 08/13/2023, 9:12 PM

## 2023-08-13 NOTE — Anesthesia Postprocedure Evaluation (Signed)
Anesthesia Post Note  Patient: Marissa Hansen  Procedure(s) Performed: AN AD HOC LABOR EPIDURAL     Patient location during evaluation: Mother Baby Anesthesia Type: Epidural Level of consciousness: awake and alert Pain management: pain level controlled Vital Signs Assessment: post-procedure vital signs reviewed and stable Respiratory status: spontaneous breathing, nonlabored ventilation and respiratory function stable Cardiovascular status: stable Postop Assessment: no headache, no backache and epidural receding Anesthetic complications: no   No notable events documented.  Last Vitals:  Vitals:   08/13/23 0931 08/13/23 1353  BP: 124/68 111/66  Pulse: (!) 102 99  Resp: 16 18  Temp: 36.8 C 36.8 C  SpO2: 100% 99%    Last Pain:  Vitals:   08/13/23 1353  TempSrc: Oral  PainSc: 0-No pain   Pain Goal:                Epidural/Spinal Function Cutaneous sensation: Normal sensation (08/13/23 1353), Patient able to flex knees: Yes (08/13/23 1353), Patient able to lift hips off bed: Yes (08/13/23 1353), Back pain beyond tenderness at insertion site: No (08/13/23 1353), Progressively worsening motor and/or sensory loss: No (08/13/23 1353), Bowel and/or bladder incontinence post epidural: No (08/13/23 1353)  Jennye Moccasin, Weldon Picking

## 2023-08-13 NOTE — Progress Notes (Signed)
Patient ID: Marissa Hansen, female   DOB: 11-13-1990, 33 y.o.   MRN: [id number] Vitals:   08/13/23 0200 08/13/23 0207 08/13/23 0215 08/13/23 0241  BP: (!) 121/48 (!) 117/51 116/64 (!) 123/51  Pulse: (!) 111 (!) 115 (!) 103 (!) 101  Resp: 18   17  Temp:      TempSrc:      SpO2:   98%   Weight:      Height:        FHR reassuring Average variability + accelerations  No decelerations  UCs somewhat irregular Will continue to observe

## 2023-08-13 NOTE — Progress Notes (Signed)
Patient ID: Marissa Hansen, female   DOB: [birth year], 33 y.o.   MRN: [id number] Started feeling more pressure Epidural topped up by anesthesia  Vitals:   08/13/23 0402 08/13/23 0408  BP: (!) 108/44 (!) 143/62  Pulse: (!) 111 100  Resp:    Temp:    SpO2:     FHR stable with accels UCs irregular  .Dilation: 10 Dilation Complete Date: 08/13/23 Dilation Complete Time: 0402 Effacement (%): 100 Station: Plus 1, Plus 2 Presentation: Vertex (confirmed by ultrasound) Exam by:: Letitia Neri, RN

## 2023-08-13 NOTE — Discharge Summary (Addendum)
Postpartum Discharge Summary     Patient Name: Marissa Hansen DOB: April 22, 1991 MRN: [id number]  Date of admission: 08/12/2023 Delivery date:08/13/2023 Delivering provider: Aviva Signs Date of discharge: 08/15/2023  Admitting diagnosis: IUGR (intrauterine growth restriction) affecting care of mother [O36.5990] Intrauterine pregnancy: [redacted]w[redacted]d     Secondary diagnosis:  Principal Problem:   IUGR (intrauterine growth restriction) affecting care of mother Active Problems:   Personal history of PE (pulmonary embolism)   Obesity affecting pregnancy, antepartum   Uterine contractions during pregnancy   Gestational hypertension   Vaginal delivery  Additional problems: Postpartum Hemorrhage (Immediate)    Discharge diagnosis: Term Pregnancy Delivered, Gestational Hypertension, and PPH                                        Post partum procedures: none Augmentation: AROM, Pitocin, Cytotec, and IP Foley Complications: Hemorrhage>1031mL  Hospital course: Induction of Labor With Vaginal Delivery   33 y.o. yo G2P1011 at [redacted]w[redacted]d was admitted to the hospital 08/12/2023 for induction of labor.  Indication for induction: Gestational hypertension.  Patient had an labor course complicated by immediate postpartum hemorrhage, managed with pitocin and TXA. Membrane Rupture Time/Date: 9:50 AM,08/12/2023  Delivery Method:Vaginal, Spontaneous Operative Delivery:N/A Episiotomy: None Lacerations:  1st degree;Periurethral Details of delivery can be found in separate delivery note.  Patient had a postpartum course remarkable for being started on Lasix 20mg  x 5d as well as full dose Lovenox x 6wk PP due to hx DVT. Her PPD#1 Hgb was 8.2 (was 11.3 on admit) and was started on po Fe. Patient is discharged home 08/15/23.  Newborn Data: Birth date:08/13/2023 Birth time:6:01 AM Gender:Female Living status:Living Apgars:8 ,9  Weight:3062 g (6lb 12oz)  Magnesium Sulfate received: No BMZ received:  No Rhophylac:N/A MMR:No T-DaP:Given prenatally Flu: No RSV Vaccine received: Yes Transfusion:No Immunizations administered: Immunization History  Administered Date(s) Administered   Dtap, Unspecified 12/18/1988, 03/03/1989, 05/01/1989, 04/27/1990, 04/02/1994   HIB, Unspecified 08/27/1989, 04/27/1990   HPV 9-valent 09/20/2014   Hep B, Unspecified 04/10/2000, 05/15/2000, 10/23/2000   Influenza Inj Mdck Quad Pf 04/10/2022   Influenza,inj,Quad PF,6+ Mos 05/08/2021   Influenza,inj,quad, With Preservative 09/21/2018, 06/14/2020   Influenza-Unspecified 09/21/2018   MMR 04/27/1990, 04/02/1994   PFIZER Comirnaty(Gray Top)Covid-19 Tri-Sucrose Vaccine 10/01/2019, 10/22/2019, 06/14/2020   Polio, Unspecified 12/18/1988, 03/03/1989, 04/27/1990, 04/02/1994   Rsv, Bivalent, Protein Subunit Rsvpref,pf Verdis Frederickson) 07/18/2023   Td 04/07/2001   Tdap 08/14/2021, 06/06/2023    Physical exam  Vitals:   08/14/23 0526 08/14/23 1300 08/14/23 2030 08/15/23 0600  BP: (!) 101/52 126/71 128/67 121/68  Pulse: 83 82 87 76  Resp: 16 17 17 18   Temp: 98 F (36.7 C) 98.5 F (36.9 C) 98.4 F (36.9 C) 98.7 F (37.1 C)  TempSrc: Oral Oral Oral Oral  SpO2: 99% 100% 100% 100%  Weight:      Height:       General: alert and cooperative Lochia: appropriate Uterine Fundus: firm Incision: N/A DVT Evaluation: No evidence of DVT seen on physical exam. Labs: Lab Results  Component Value Date   WBC 14.0 (H) 08/14/2023   HGB 8.2 (L) 08/14/2023   HCT 25.8 (L) 08/14/2023   MCV 82.4 08/14/2023   PLT 262 08/14/2023      Latest Ref Rng & Units 08/12/2023    1:17 AM  CMP  Glucose 70 - 99 mg/dL 86   BUN 6 - 20  mg/dL 7   Creatinine 2.95 - 2.84 mg/dL 1.32   Sodium 440 - 102 mmol/L 135   Potassium 3.5 - 5.1 mmol/L 3.5   Chloride 98 - 111 mmol/L 104   CO2 22 - 32 mmol/L 19   Calcium 8.9 - 10.3 mg/dL 8.9   Total Protein 6.5 - 8.1 g/dL 6.1   Total Bilirubin 0.0 - 1.2 mg/dL 0.4   Alkaline Phos 38 - 126 U/L 67    AST 15 - 41 U/L 16   ALT 0 - 44 U/L 15    Edinburgh Score:    08/13/2023    8:26 AM  Edinburgh Postnatal Depression Scale Screening Tool  I have been able to laugh and see the funny side of things. 0  I have looked forward with enjoyment to things. 0  I have blamed myself unnecessarily when things went wrong. 0  I have been anxious or worried for no good reason. 2  I have felt scared or panicky for no good reason. 1  Things have been getting on top of me. 0  I have been so unhappy that I have had difficulty sleeping. 0  I have felt sad or miserable. 0  I have been so unhappy that I have been crying. 0  The thought of harming myself has occurred to me. 0  Edinburgh Postnatal Depression Scale Total 3      After visit meds:  Allergies as of 08/15/2023   No Known Allergies      Medication List     STOP taking these medications    aspirin EC 81 MG tablet   freestyle lancets   FREESTYLE LITE test strip Generic drug: glucose blood   FreeStyle Lite w/Device Kit   ipratropium-albuterol 0.5-2.5 (3) MG/3ML Soln Commonly known as: DUONEB   valACYclovir 500 MG tablet Commonly known as: VALTREX       TAKE these medications    budesonide-formoterol 80-4.5 MCG/ACT inhaler Commonly known as: SYMBICORT Inhale 2 puffs into the lungs 2 (two) times daily.   enoxaparin 60 MG/0.6ML injection Commonly known as: LOVENOX Inject 0.6 mLs (60 mg total) into the skin every 12 (twelve) hours.   ferrous sulfate 325 (65 FE) MG tablet Take 1 tablet (325 mg total) by mouth daily with breakfast.   furosemide 20 MG tablet Commonly known as: LASIX Take 1 tablet (20 mg total) by mouth daily.   ibuprofen 600 MG tablet Commonly known as: ADVIL Take 1 tablet (600 mg total) by mouth every 6 (six) hours as needed.   multivitamin-prenatal 27-0.8 MG Tabs tablet Take 1 tablet by mouth daily at 12 noon.   potassium chloride SA 20 MEQ tablet Commonly known as: KLOR-CON M Take 2 tablets  (40 mEq total) by mouth daily.         Discharge home in stable condition Infant Feeding: Breast Infant Disposition: dispo per peds Discharge instruction: per After Visit Summary and Postpartum booklet. Activity: Advance as tolerated. Pelvic rest for 6 weeks.  Diet: routine diet Anticipated Birth Control: Condoms Postpartum Appointment:6 weeks Additional Postpartum F/U: BP check 1 week Future Appointments: Future Appointments  Date Time Provider Department Center  09/22/2023  1:35 PM Jerene Bears, MD [facility]   Follow up Visit:  Follow-up Information     [facility] at [facility] Follow up in 1 week(s).   Specialty: Obstetrics and Gynecology Why: blood pressure check Contact information: [address] [place] [address] [phone]  Arabella Merles, CNM  P [facility] Admin Please schedule this patient for Postpartum visit in: 6 weeks with the following provider: Any provider In-Person For C/S patients schedule nurse incision check in weeks 2 weeks: no High risk pregnancy complicated by: gHTN, hx DVT Delivery mode:  SVD Anticipated Birth Control:  Condoms PP Procedures needed: BP check 1wk RN Schedule Integrated BH visit: no     08/15/2023 Reva Bores, MD 8:50 AM

## 2023-08-14 ENCOUNTER — Ambulatory Visit: Payer: 59

## 2023-08-14 ENCOUNTER — Other Ambulatory Visit (HOSPITAL_COMMUNITY): Payer: Self-pay

## 2023-08-14 LAB — CBC
HCT: 25.8 % — ABNORMAL LOW (ref 36.0–46.0)
Hemoglobin: 8.2 g/dL — ABNORMAL LOW (ref 12.0–15.0)
MCH: 26.2 pg (ref 26.0–34.0)
MCHC: 31.8 g/dL (ref 30.0–36.0)
MCV: 82.4 fL (ref 80.0–100.0)
Platelets: 262 10*3/uL (ref 150–400)
RBC: 3.13 MIL/uL — ABNORMAL LOW (ref 3.87–5.11)
RDW: 18.6 % — ABNORMAL HIGH (ref 11.5–15.5)
WBC: 14 10*3/uL — ABNORMAL HIGH (ref 4.0–10.5)
nRBC: 0 % (ref 0.0–0.2)

## 2023-08-14 MED ORDER — IBUPROFEN 600 MG PO TABS
600.0000 mg | ORAL_TABLET | Freq: Four times a day (QID) | ORAL | 0 refills | Status: AC | PRN
  Filled 2023-08-14: qty 30, 8d supply, fill #0

## 2023-08-14 MED ORDER — FUROSEMIDE 20 MG PO TABS
20.0000 mg | ORAL_TABLET | Freq: Every day | ORAL | 0 refills | Status: DC
  Filled 2023-08-14: qty 4, 4d supply, fill #0

## 2023-08-14 MED ORDER — FUROSEMIDE 20 MG PO TABS
20.0000 mg | ORAL_TABLET | Freq: Every day | ORAL | Status: DC
  Administered 2023-08-14 – 2023-08-15 (×2): 20 mg via ORAL
  Filled 2023-08-14 (×2): qty 1

## 2023-08-14 MED ORDER — FERROUS FUMARATE 324 (106 FE) MG PO TABS
1.0000 | ORAL_TABLET | ORAL | Status: DC
  Administered 2023-08-14: 106 mg via ORAL
  Filled 2023-08-14: qty 1

## 2023-08-14 NOTE — Progress Notes (Signed)
TC from Pleas Koch, RN @ [phone] that the baby will not be discharged today and the patient is desiring to not be discharged home early today. Request to have discharge order discontinued. Verbal order to discontinue discharge order given.  Patient rounded on today by Philipp Deputy, CNM. Please see the Discharge Summary from today. When patient is discharge home tomorrow, that Discharge Summary will be updated at that time.  Dr. Alvester Morin made aware of discontinuation of discharge order for today. Ok with no early discharge home today.  Raelyn Mora, CNM  08/14/2023 11:16 AM

## 2023-08-14 NOTE — Lactation Note (Signed)
This note was copied from a baby's chart. Lactation Consultation Note  Patient Name: Marissa Hansen [id number] Date: 08/14/2023 Age:35 hours Reason for consult: 1st time breastfeeding;Mother's request;Difficult latch;Early term 37-38.6wks (See MOB : MR hx anemia, DVT and GHTN) MOB requested latch assistance due to infant having short intervals of feeding 5 minutes or less, and difficult latch, when infant comes off the breast she cannot get him to re-latch. MOB was concerned she did not have colostrum, LC used breast model and MOB self expressed infant was given 2 mls of EBM by spoon and became more alert. MOB latched infant on her left breast with pillow support using the football hold position, infant sustained latch with depth and was still breastfeeding when LC left the room. LC discussed breast stimulation techniques to keep infant engaged and actively feeding at the breast.  Current BF plan: 1- MOB will continue to BF infant by cues, on demand , every 2-3 hours, skin to skin. Will used breast stimulation techniques discussed by LC 2- MOB will work on increase duration time with infant at the breast greater than 5 minutes on day 2 of life, MOB knows to ask for further latch assistance if needed. 3- MOB will attempt to use DEBP that was set up in her room, MOB knows to pump every 3 hours for 15 minutes on initial setting to help establish her milk supply as she continue to work on infant latching at the breast.   Maternal Data    Feeding Mother's Current Feeding Choice: Breast Milk  LATCH Score Latch: Grasps breast easily, tongue down, lips flanged, rhythmical sucking.  Audible Swallowing: Spontaneous and intermittent  Type of Nipple: Everted at rest and after stimulation  Comfort (Breast/Nipple): Soft / non-tender  Hold (Positioning): Assistance needed to correctly position infant at breast and maintain latch.  LATCH Score: 9   Lactation Tools Discussed/Used     Interventions Interventions: Assisted with latch;Skin to skin;Breast compression;Adjust position;Support pillows;Position options;Hand express;Expressed milk;Breast massage;Education  Discharge    Consult Status Consult Status: Follow-up Date: 08/15/23 Follow-up type: In-patient    Frederico Hamman 08/14/2023, 6:34 PM

## 2023-08-14 NOTE — Progress Notes (Signed)
Patient now able to access education baby scripts, signes patient up in orders for babyscript htn. Patient has a bp cuff at home. With check back to see if patient has access to babyscript htn.

## 2023-08-15 ENCOUNTER — Other Ambulatory Visit (HOSPITAL_COMMUNITY): Payer: Self-pay

## 2023-08-15 MED ORDER — POTASSIUM CHLORIDE CRYS ER 20 MEQ PO TBCR
40.0000 meq | EXTENDED_RELEASE_TABLET | Freq: Every day | ORAL | 0 refills | Status: DC
  Filled 2023-08-15: qty 5, 2d supply, fill #0

## 2023-08-15 NOTE — Lactation Note (Addendum)
This note was copied from a baby's chart. Lactation Consultation Note  Patient Name: Boy Raevyn Sokol [id number] Date: 08/15/2023 Age:35 hours Reason for consult: Follow-up assessment;1st time breastfeeding;Infant weight loss;Early term 37-38.6wks  PCOS  P1, Baby out of room for circumcision. 7.8% weight loss. 4 voids/5 stools in the last 24 hours. Suggest family call for [facility] to view next feeding and review plan for at home feeding/pumping.  Reviewed engorgement care and monitoring voids/stools.  Maternal Data Has patient been taught Hand Expression?: Yes Does the patient have breastfeeding experience prior to this delivery?: No  Feeding Mother's Current Feeding Choice: Breast Milk   Interventions Interventions: Education  Discharge Discharge Education: Engorgement and breast care;Warning signs for feeding baby Pump: Personal  Consult Status Consult Status: Follow-up Date: 08/15/23 Follow-up type: In-patient    Dahlia Byes [facility]  RN IBCLC 08/15/2023, 11:18 AM

## 2023-08-15 NOTE — Lactation Note (Signed)
This note was copied from a baby's chart. Lactation Consultation Note  Patient Name: Marissa Hansen ZOXWR'U Date: 08/15/2023 Age:35 hours Reason for consult: Follow-up assessment;1st time breastfeeding  P1, Baby [redacted]w[redacted]d. 7.8 %Baby was circumcised and has not fed since 0900. Mother has attempted numerous times and baby would not wake.  LC unwrapped baby and attempted with no success. Mother has pumped once today.  Encouraged mother to pump q 3 hours.  Mother did a good job with hand expression and gave baby a small amount of colostrum. Parents wanting to go home. Discussed supplementing with formula until baby wake to feed.  Parents agreeable Demonstrated how to pace feed with slow flow nipple.  Baby consumed 20 ml with Nfant white standard nipple. Reviewed volume guidelines. Feed on demand with cues.  Goal 8-12+ times per day after first 24 hrs.  Place baby STS if not cueing.  Wake to feed if baby does not wake after 3 hours.     Maternal Data Has patient been taught Hand Expression?: Yes  Feeding Mother's Current Feeding Choice: Breast Milk and Formula  Lactation Tools Discussed/Used Tools: Pump Pumping frequency: Recommend q 3 hours for 15 min  Interventions Interventions: Education;DEBP  Discharge Pump: Personal;DEBP  Consult Status Consult Status: Complete Date: 08/15/23    Dahlia Byes Boschen  RN IBCLC 08/15/2023, 4:08 PM

## 2023-08-16 IMAGING — US US OB TRANSVAGINAL
1 series · 15 of 28 positions shown · non-contrast
Comparison: None Available.

CLINICAL DATA: Pregnant patient.  Bleeding.  Known missed abortion.

EXAM:
TWIN OBSTETRICAL ULTRASOUND <14 WKS
TECHNIQUE: Transabdominal ultrasound was performed for evaluation of the
gestation as well as the maternal uterus and adnexal regions.

[Series 1: us ob transvaginal · 61 acquisitions, 15 frames shown]
[im 1/61]
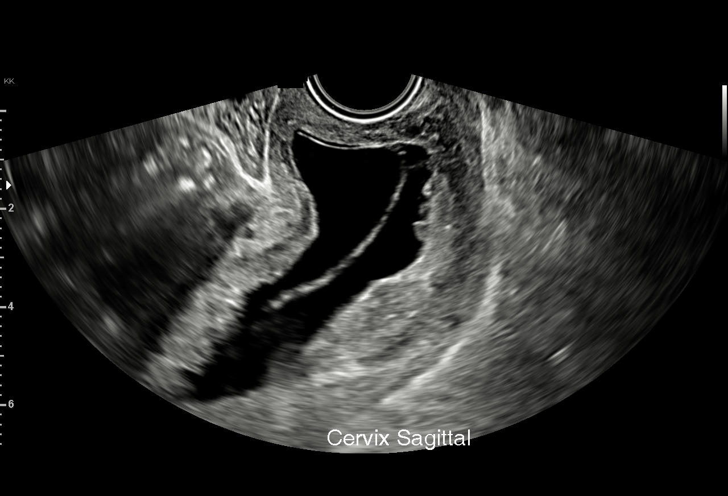
[im 5/61]
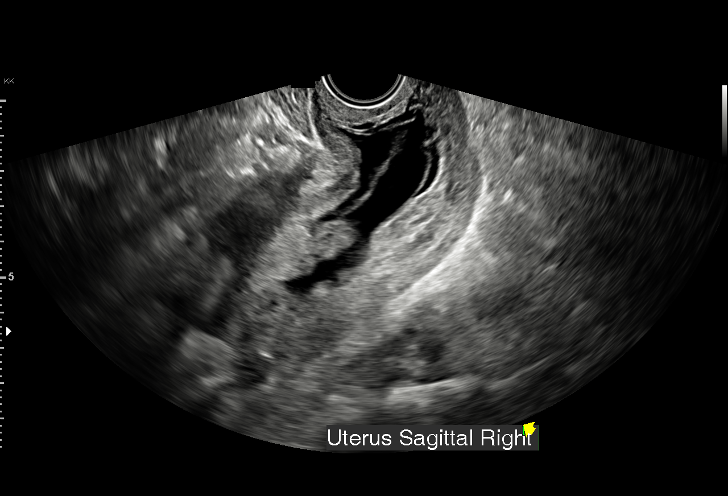
[im 9/61]
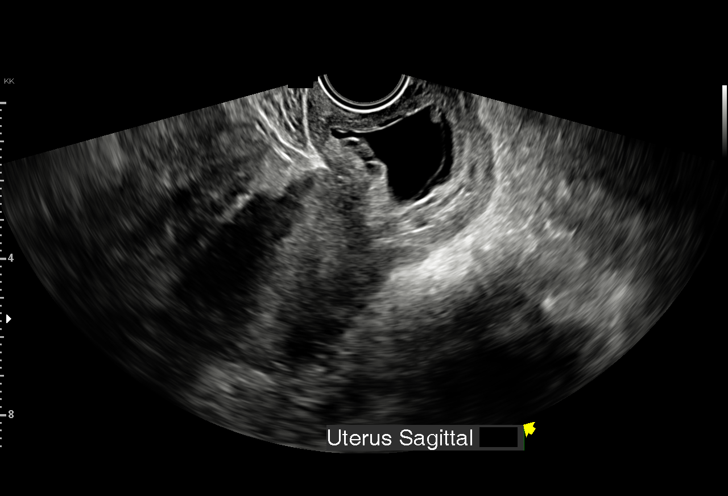
[im 14/61]
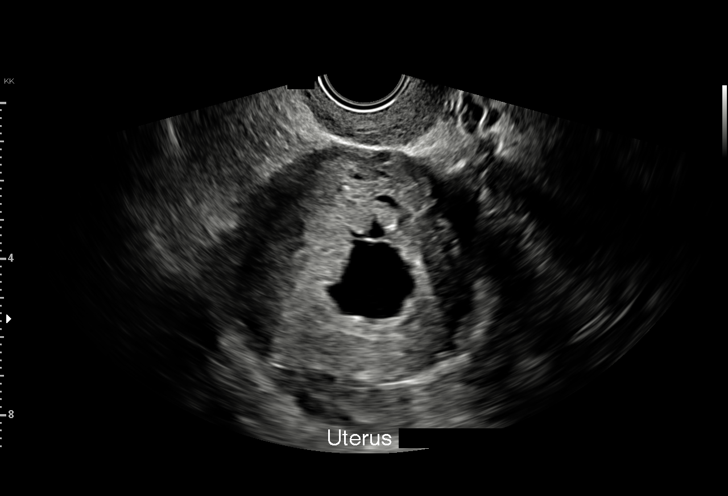
[im 18/61]
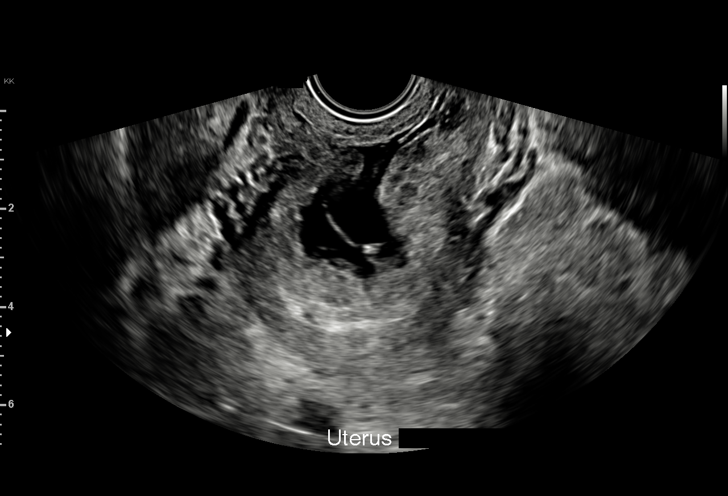
[im 23/61]
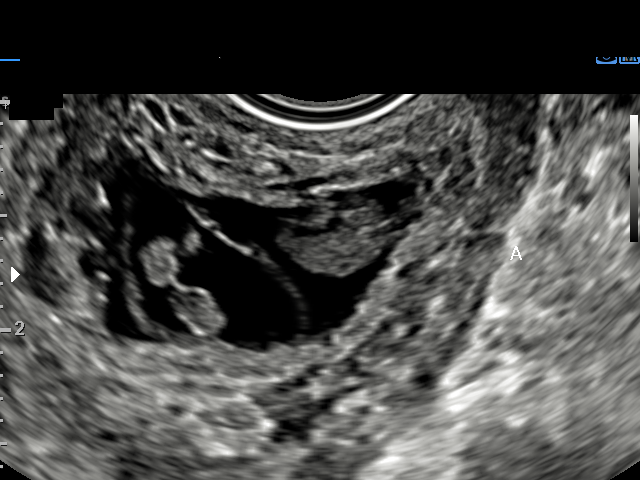
[im 27/61]
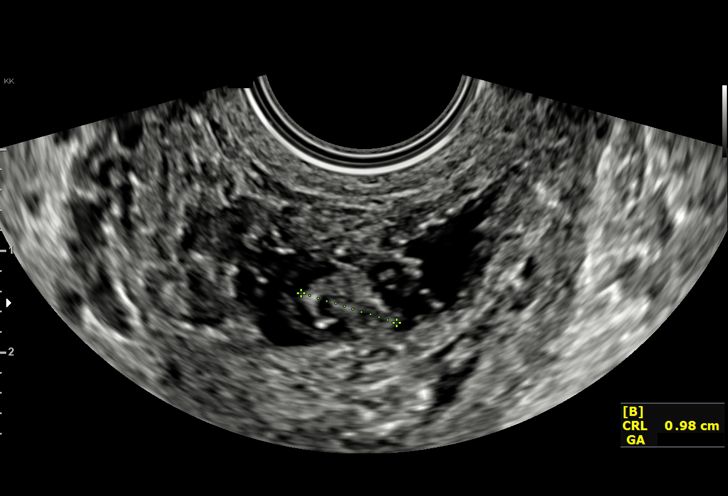
[im 32/61]
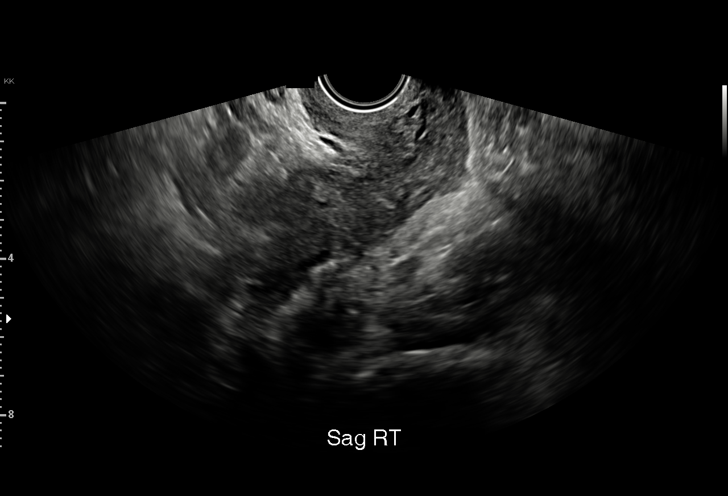
[im 34/61]
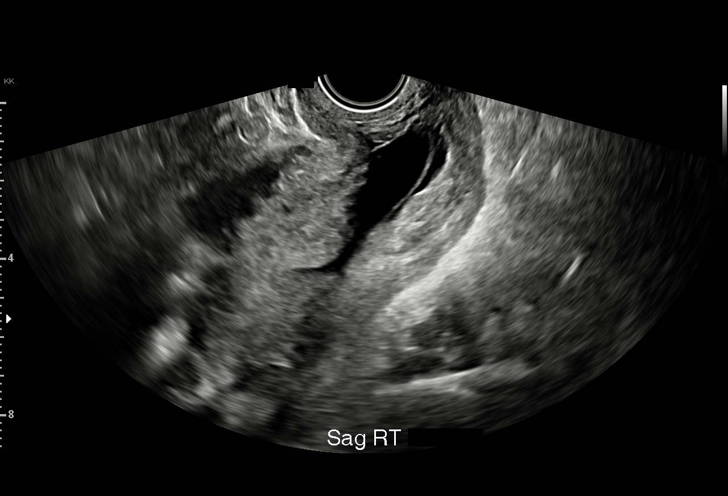
[im 38/61]
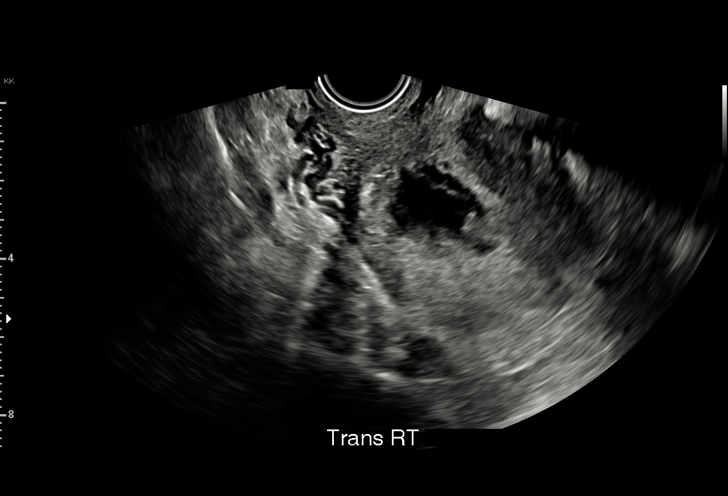
[im 43/61]
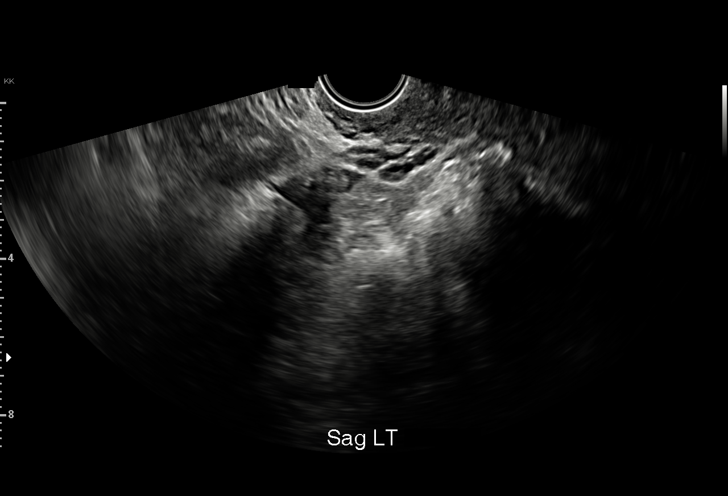
[im 47/61]
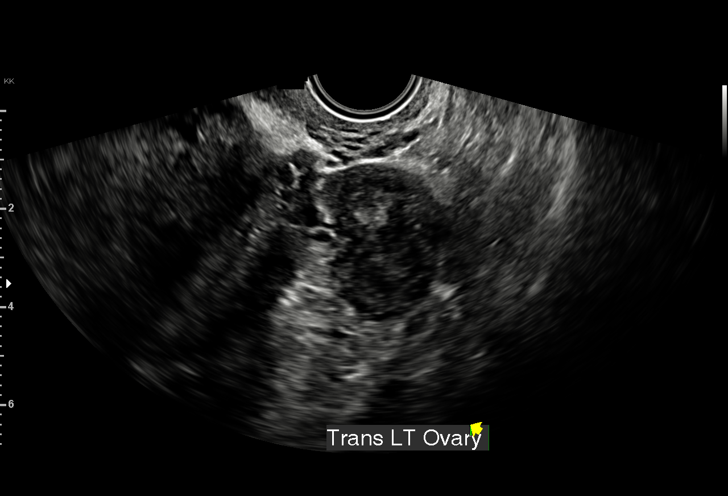
[im 52/61]
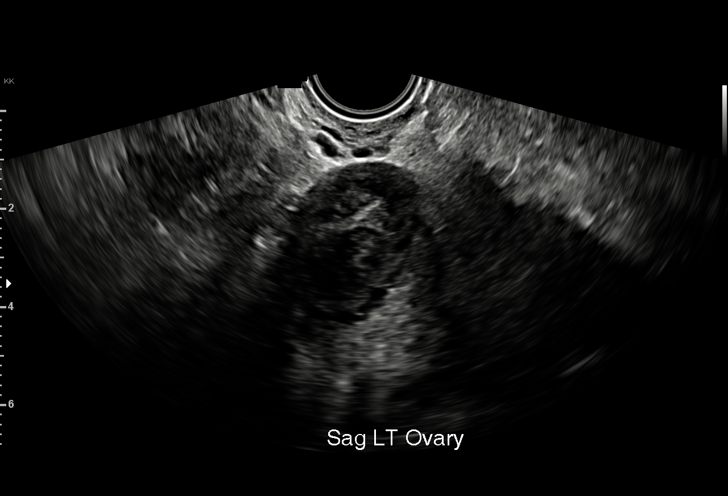
[im 56/61]
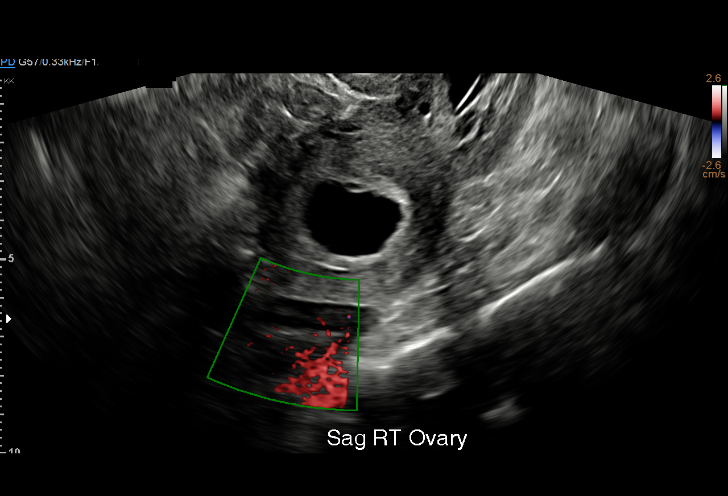
[im 61/61]
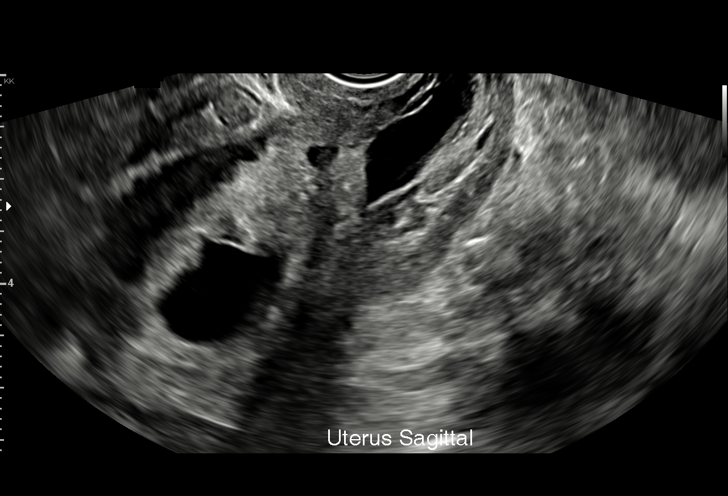

[15 of 28 positions shown; findings below may reference images not displayed]

FINDINGS: Number of IUPs:  2

Chorionicity/Amnionicity:  Mono chorionic, diamniotic

TWIN 1

Yolk sac:  No

Embryo:  Yes

Cardiac Activity: No

MSD:   mm    w     d

CRL:   11.1 mm   7 w 1 d                  US EDC: July 19, 2022

TWIN 2

Yolk sac:  No

Embryo:  Yes

Cardiac Activity: No

MSD:   mm    w     d

CRL:   9.8 mm   7 w 0 d                  US EDC: July 20, 2022

Subchorionic hemorrhage:  None visualized.

Maternal uterus/adnexae: The ovaries are unremarkable.

Other: 2 gestational sacs are identified, extending from the fundus
into the lower uterine segment and cervix.
IMPRESSION: The findings are consistent with the history of missed spontaneous
abortion of twin pregnancy. No fetal heart tones identified in
either embryo. The 2 gestational sacs extend from the fundus into
the lower uterine segment/cervix.

## 2023-08-19 ENCOUNTER — Inpatient Hospital Stay (HOSPITAL_COMMUNITY): Payer: 59

## 2023-08-21 ENCOUNTER — Telehealth (HOSPITAL_COMMUNITY): Payer: Self-pay | Admitting: *Deleted

## 2023-08-21 DIAGNOSIS — Z1331 Encounter for screening for depression: Secondary | ICD-10-CM

## 2023-08-21 NOTE — Telephone Encounter (Signed)
 08/21/2023  Name: Marissa Hansen MRN: [id number] DOB: 12-28-1990  Reason for Call:  Transition of Care Hospital Discharge Call  Contact Status: Patient Contact Status: Complete  Language assistant needed: Interpreter Mode: Interpreter Not Needed        Follow-Up Questions: Do You Have Any Concerns About Your Health As You Heal From Delivery?: No Do You Have Any Concerns About Your Infants Health?: Yes What Concerns Do You Have About Your Baby?: They are working on weight gain. Current feeding plan is breastfeeding with formula supplementation.  They are having difficulties with breastfeeding.  Seeing pediatrician frequently for weight checks and patient has LC who comes to her home.  Patient feels she has the resources she needs at this time to work on infant feeding.  Edinburgh Postnatal Depression Scale:  In the Past 7 Days: I have been able to laugh and see the funny side of things.: Definitely not so much now I have looked forward with enjoyment to things.: Definitely less than I used to I have blamed myself unnecessarily when things went wrong.: Yes, some of the time I have been anxious or worried for no good reason.: Yes, very often I have felt scared or panicky for no good reason.: Yes, sometimes Things have been getting on top of me.: Yes, sometimes I haven't been coping as well as usual I have been so unhappy that I have had difficulty sleeping.: Not very often I have felt sad or miserable.: Not very often I have been so unhappy that I have been crying.: Yes, quite often The thought of harming myself has occurred to me.: Never Edinburgh Postnatal Depression Scale Total: (!) 17  PHQ2-9 Depression Scale:     Discharge Follow-up: Edinburgh score requires follow up?: Yes Provider notified of Edinburgh score?: Yes Have you already been referred for a counseling appointment?: No Patient was advised of the following resources:: Support Group, Breastfeeding Support  Group  Post-discharge interventions: Reviewed Newborn Safe Sleep Practices Maternal Mental Health Resources provided Placed  [facility] referral in patient's chart  Mliss Sieve, RN 08/21/2023 12:02

## 2023-08-28 ENCOUNTER — Telehealth (HOSPITAL_BASED_OUTPATIENT_CLINIC_OR_DEPARTMENT_OTHER): Payer: Self-pay | Admitting: *Deleted

## 2023-08-28 NOTE — Telephone Encounter (Signed)
Called pt after home health nurse called to let us know that pt scored 19 on edinburgh. Pt denies any thoughts of harming herself or baby. Pt has appt for BP check tomorrow. Pt request prescription of reglan to increase milk supply. Request sent to provider.

## 2023-08-29 ENCOUNTER — Encounter (HOSPITAL_BASED_OUTPATIENT_CLINIC_OR_DEPARTMENT_OTHER): Payer: Self-pay | Admitting: Certified Nurse Midwife

## 2023-08-29 ENCOUNTER — Ambulatory Visit (HOSPITAL_BASED_OUTPATIENT_CLINIC_OR_DEPARTMENT_OTHER): Payer: 59 | Admitting: Certified Nurse Midwife

## 2023-08-29 VITALS — BP 126/69 | HR 57 | Wt 275.0 lb

## 2023-08-29 DIAGNOSIS — O9081 Anemia of the puerperium: Secondary | ICD-10-CM

## 2023-08-29 MED ORDER — MISC. DEVICES MISC: 2 refills | Status: DC

## 2023-08-29 MED ORDER — FLUOXETINE HCL 20 MG PO CAPS: 20.0000 mg | ORAL_CAPSULE | Freq: Every day | ORAL | 1 refills | Status: DC

## 2023-08-29 MED ORDER — METOCLOPRAMIDE HCL 10 MG PO TABS: 10.0000 mg | ORAL_TABLET | Freq: Four times a day (QID) | ORAL | 0 refills | Status: DC

## 2023-08-29 NOTE — Progress Notes (Signed)
Subjective:     Marissa Hansen is a 33 y.o. female G2P1011 PPD16 after Vaginal Delivery of a full term healthy female "Marissa Hansen" on 08/13/23. Her postpartum hemoglobin was 8.2. Pt misunderstood and thought she was supposed to stop taking Iron after discharge-has not been taking Iron at home but agreeable to starting Ferrous Sulfate 325mg  po once a day. Baby was 6lb 12oz. Pt reports he is not gaining weight as quickly as expected and the pediatrician has encouraged her to bottlefeed him so that she can measure intake. Pt reports baby have normal amounts pees/poops daily. She strongly desires to feed the baby at the breast and would like to make breastfeeding at the breast work. She feels like her milk supply may be decreased and "not enough" for Marissa Hansen. We had a long conversation today about measures to try to increase milk supply (increased hydration, pumping, power-pumping, fenugreek, blessed thistle, domperidone, Reglan). Discussed there are risks to the medications that are galactogues and she is to monitor her mental state and report any worsening in depression.  Pt does feel somewhat depressed. Her Mom has dementia and this seems very hard on the patient emotionally. Her Mom and Dad are local and supportive. Her [name] has been very supportive and helpful at caring for baby. Patient is on Lovenox twice daily for DVT prevention.  The following portions of the patient's history were reviewed and updated as appropriate: allergies, current medications, past family history, past medical history, past social history, past surgical history, and problem list.  Review of Systems Pertinent items are noted in HPI.   Objective:    BP 126/69 (BP Location: Left Arm, Patient Position: Sitting, Cuff Size: Large)   Pulse (!) 57   Wt 275 lb (124.7 kg)   LMP 11/23/2022 (Exact Date)   BMI 38.35 kg/m   General:  alert and cooperative   Breasts:  lactating                                   Assessment:     G2P1011 Term SVD 08/13/23 now at PPD 16 here for postpartum depression and low milk supply. Breastfeeding difficulties (low milk supply) Anemia  Plan:   Discussed/pt aware of availability medication and/or counseling. She desires to start Fluoxetine 20mg  po at bedtime. Goal is to decrease anxiety and increase her sense of calmness. Emotional support given Long conversation about potential ways to increase milk supply Discussed that on occasion we encounter insufficient glandular tissue (IGT) where mammary glands do not produce enough milk-producing tissue Pt agreeable to follow-up with Lactation Consultant. She will contact [facility] and see if insurance will cover in-home lactation support. If not, pt aware she can call [facility] and schedule an outpatient appointment for assistance with lactation. She is currently on Fenugreek (increase to three tabs three times daily) She is considering Reglan or domperidone and is aware of risk for increasing depression. She will monitor. Restart Ferrous Sulfate 325mg  po daily (anemia) CBC pending today Continue Lovenox as directed twice daily RTO in 4 weeks for postpartum visit and prn if issues arise. Letta Kocher

## 2023-09-06 LAB — CBC
Hematocrit: 33.4 % — ABNORMAL LOW (ref 34.0–46.6)
Hemoglobin: 10.5 g/dL — ABNORMAL LOW (ref 11.1–15.9)
MCH: 25.7 pg — ABNORMAL LOW (ref 26.6–33.0)
MCHC: 31.4 g/dL — ABNORMAL LOW (ref 31.5–35.7)
MCV: 82 fL (ref 79–97)
Platelets: 425 10*3/uL (ref 150–450)
RBC: 4.09 x10E6/uL (ref 3.77–5.28)
RDW: 15.6 % — ABNORMAL HIGH (ref 11.7–15.4)
WBC: 5.8 10*3/uL (ref 3.4–10.8)

## 2023-09-06 LAB — COMPREHENSIVE METABOLIC PANEL
ALT: 23 IU/L (ref 0–32)
AST: 19 IU/L (ref 0–40)
Albumin: 3.6 g/dL — ABNORMAL LOW (ref 3.9–4.9)
Alkaline Phosphatase: 80 IU/L (ref 44–121)
BUN/Creatinine Ratio: 16 (ref 9–23)
BUN: 14 mg/dL (ref 6–20)
Bilirubin Total: <0.2 mg/dL (ref 0.0–1.2)
CO2: 23 mmol/L (ref 20–29)
Calcium: 8.4 mg/dL — ABNORMAL LOW (ref 8.7–10.2)
Chloride: 106 mmol/L (ref 96–106)
Creatinine, Ser: 0.9 mg/dL (ref 0.57–1.00)
Globulin, Total: 2.5 g/dL (ref 1.5–4.5)
Glucose: 76 mg/dL (ref 70–99)
Potassium: 4.6 mmol/L (ref 3.5–5.2)
Sodium: 141 mmol/L (ref 134–144)
Total Protein: 6.1 g/dL (ref 6.0–8.5)
eGFR: 86 mL/min/{1.73_m2} (ref >59)

## 2023-09-06 LAB — IRON AND TIBC
Iron Saturation: 5 % — CL (ref 15–55)
Iron: 21 ug/dL — ABNORMAL LOW (ref 27–159)
Total Iron Binding Capacity: 406 ug/dL (ref 250–450)
UIBC: 385 ug/dL (ref 131–425)

## 2023-09-06 LAB — TSH: TSH: 1.15 u[IU]/mL (ref 0.450–4.500)

## 2023-09-06 LAB — VITAMIN D, 25-HYDROXY, TOTAL: Vitamin D, 25-Hydroxy, Serum: 28 ng/mL — ABNORMAL LOW

## 2023-09-08 ENCOUNTER — Encounter (HOSPITAL_BASED_OUTPATIENT_CLINIC_OR_DEPARTMENT_OTHER): Payer: Self-pay | Admitting: Certified Nurse Midwife

## 2023-09-08 NOTE — BH Specialist Note (Unsigned)
 Integrated Behavioral Health via Telemedicine Visit  09/18/2023 Marissa Hansen [id number]  Number of Integrated Behavioral Health Clinician visits: 1- Initial Visit  Session Start time: 0812   Session End time: 0844  Total time in minutes: 32   Referring Provider: Scheryl Darter, MD Patient/Family location: Home [facility] Provider location: Center for [facility] at [facility]  All persons participating in visit: Patient Marissa Hansen and Marissa Hansen   Types of Service: Individual psychotherapy and Video visit  I connected with Marissa Hansen and/or Marissa Hansen  n/a  via  Telephone or Video Enabled Telemedicine Application  (Video is Caregility application) and verified that I am speaking with the correct person using two identifiers. Discussed confidentiality: Yes   I discussed the limitations of telemedicine and the availability of in person appointments.  Discussed there is a possibility of technology failure and discussed alternative modes of communication if that failure occurs.  I discussed that engaging in this telemedicine visit, they consent to the provision of behavioral healthcare and the services will be billed under their insurance.  Patient and/or legal guardian expressed understanding and consented to Telemedicine visit: Yes   Presenting Concerns: Patient and/or family reports the following symptoms/concerns: Anxiety, depression, worry, irritability, poor sleep quality and fatigue; trouble falling back to sleep after waking to tend to baby, attributes to ruminating on negative thoughts; concern about ongoing headaches postpartum (taking Motrin PM nightly for headaches and melatonin for sleep; does not feel well managed).  Duration of problem: Postpartum; Severity of problem:  moderately severe  Patient and/or Family's Strengths/Protective Factors: Social connections, Concrete supports in place (healthy food, safe  environments, etc.), and Sense of purpose  Goals Addressed: Patient will:  Reduce symptoms of: anxiety and depression   Increase knowledge and/or ability of: healthy habits and self-management skills   Demonstrate ability to: Increase healthy adjustment to current life circumstances, Increase adequate support systems for patient/family, and Increase motivation to adhere to plan of care  Progress towards Goals: Ongoing  Interventions: Interventions utilized:  Mindfulness or Management consultant, Medication Monitoring, Psychoeducation and/or Health Education, and Link to [facility] Standardized Assessments completed: GAD-7 and PHQ 9  Patient and/or Family Response: Patient agrees with treatment plan.  Assessment: Patient currently experiencing Adjustment disorder with mixed anxious and depressed mood.   Patient may benefit from psychoeducation and brief therapeutic interventions regarding coping with symptoms of depression and anxiety .  Plan: Follow up with behavioral health clinician on : Two weeks Behavioral recommendations:  -Continue taking Prozac and iron as prescribed; discuss any changes with taking Motrin PM and melatonin at night, at upcoming medical appointment -CALM relaxation breathing exercise twice daily (morning; at bedtime with sleep sounds); as needed throughout the day. -Continue prioritizing healthy self-care (regular meals, adequate rest; allowing practical help from supportive friends and family) until at least postpartum medical appointment -Consider new mom support group as needed at either [email or web address] or [email or web address]   Referral(s): Integrated Art gallery manager (In Clinic) and [facility]:  new parent support  I discussed the assessment and treatment plan with the patient and/or parent/guardian. They were provided an opportunity to ask questions and all were answered. They agreed with the plan and demonstrated an  understanding of the instructions.   They were advised to call back or seek an in-person evaluation if the symptoms worsen or if the condition fails to improve as anticipated.  Rae Lips, LCSW     09/18/2023    8:19  AM 07/31/2023    9:45 AM 06/06/2023    9:00 AM 01/23/2023   12:44 PM 01/16/2022    3:29 PM  Depression screen PHQ 2/9  Decreased Interest 1 0 1 1 0  Down, Depressed, Hopeless 1 1 1 1  0  PHQ - 2 Score 2 1 2 2  0  Altered sleeping 3 1 1 2    Tired, decreased energy 3 2 2 2    Change in appetite 0 2 2 1    Feeling bad or failure about yourself  1 0 1 1   Trouble concentrating 0 0 0 1   Moving slowly or fidgety/restless 0 0 0 0   Suicidal thoughts 0 0 0 0   PHQ-9 Score 9 6 8 9        09/18/2023    8:21 AM 07/31/2023    9:45 AM 06/06/2023    9:00 AM 01/23/2023   12:44 PM  GAD 7 : Generalized Anxiety Score  Nervous, Anxious, on Edge 3 1 2 1   Control/stop worrying 3 1 2 1   Worry too much - different things 3 1 2 1   Trouble relaxing 1 0 1 1  Restless 0 0 1 1  Easily annoyed or irritable 3 1 1 2   Afraid - awful might happen 1 1 1 3   Total GAD 7 Score 14 5 10  10

## 2023-09-10 ENCOUNTER — Encounter (HOSPITAL_BASED_OUTPATIENT_CLINIC_OR_DEPARTMENT_OTHER): Payer: Self-pay | Admitting: Obstetrics & Gynecology

## 2023-09-18 ENCOUNTER — Ambulatory Visit (INDEPENDENT_AMBULATORY_CARE_PROVIDER_SITE_OTHER): Payer: Self-pay | Admitting: Clinical

## 2023-09-18 DIAGNOSIS — F4323 Adjustment disorder with mixed anxiety and depressed mood: Secondary | ICD-10-CM

## 2023-09-18 NOTE — Patient Instructions (Signed)
 Center for [facility] Healthcare at [facility] Women [address] [place], [place] [address] [phone] (main office) [phone] [facility] office)  New Parent Support Groups [email or web address]

## 2023-09-22 ENCOUNTER — Encounter (HOSPITAL_BASED_OUTPATIENT_CLINIC_OR_DEPARTMENT_OTHER): Payer: Self-pay | Admitting: Obstetrics & Gynecology

## 2023-09-22 ENCOUNTER — Ambulatory Visit (HOSPITAL_BASED_OUTPATIENT_CLINIC_OR_DEPARTMENT_OTHER): Payer: Self-pay | Admitting: Family Medicine

## 2023-09-22 ENCOUNTER — Ambulatory Visit (HOSPITAL_BASED_OUTPATIENT_CLINIC_OR_DEPARTMENT_OTHER): Payer: 59 | Admitting: Obstetrics & Gynecology

## 2023-09-22 ENCOUNTER — Ambulatory Visit (HOSPITAL_BASED_OUTPATIENT_CLINIC_OR_DEPARTMENT_OTHER): Payer: 59 | Admitting: Certified Nurse Midwife

## 2023-09-22 DIAGNOSIS — Z86711 Personal history of pulmonary embolism: Secondary | ICD-10-CM

## 2023-09-22 DIAGNOSIS — R768 Other specified abnormal immunological findings in serum: Secondary | ICD-10-CM

## 2023-09-22 NOTE — Progress Notes (Unsigned)
 Post Partum Visit Note  Marissa Hansen is a 33 y.o. G65P1011 female who presents for a postpartum visit. She is 6 weeks postpartum following a normal spontaneous vaginal delivery.  I have fully reviewed the prenatal and intrapartum course. The delivery was at 37 4/7 gestational weeks.  Anesthesia: epidural. Postpartum course has been good except for some mood changes.  Baby is doing well.  Baby is feeding by bottle - similac 360 .  Bleeding stopped about 4 weeks post partum. Bowel function is normal. Bladder function is normal.  Patient is not sexually active. Contraception method is condoms.  Postpartum depression screening: positive.  Was prescribed prozac postpartum.  Has decided she is feeling better and does not want to take this.   Health Maintenance Due  Topic Date Due   Pneumococcal Vaccine 8-33 Years old (1 of 2 - PCV) Never done   HPV VACCINES (2 - 3-dose series) 10/18/2014   INFLUENZA VACCINE  02/13/2023   COVID-19 Vaccine (4 - 2024-25 season) 03/16/2023    The following portions of the patient's history were reviewed and updated as appropriate: allergies, current medications, past family history, past medical history, past social history, past surgical history, and problem list.  Review of Systems Pertinent items are noted in HPI.  Objective:  BP (!) 154/65 (BP Location: Right Arm, Patient Position: Sitting, Cuff Size: Large)   Pulse 70   Ht 5\' 10"  (1.778 m)   Wt 270 lb (122.5 kg)   LMP 11/23/2022 (Exact Date)   BMI 38.74 kg/m    General:  alert, cooperative, and no distress   Breasts:  normal  Lungs: clear to auscultation bilaterally  Heart:  regular rate and rhythm, S1, S2 normal, no murmur, click, rub or gallop  Abdomen: soft, non-tender; bowel sounds normal; no masses,  no organomegaly   Wound  N/a  GU exam:   NAEFG, well healed perineum, no vaginal discharge or blood, cervix without lesions, uterus normal sized, mobile and NT       Assessment:  1.  Postpartum care and examination (Primary)  2. History of pulmonary embolism - Ambulatory referral to Hematology / Oncology  3. HSV-2 seropositive  Plan:   Essential components of care per ACOG recommendations:  1.  Mood and well being: Patient with positive depression screening today. Reviewed local resources for support.  She is much improved with symptoms and does not want treatment at this time.  Will continue to monitor symptoms and let me know if there are changes. - Patient tobacco use? No.   - hx of drug use? No.    2. Infant care and feeding:  -Patient currently breastmilk feeding?  -Social determinants of health (SDOH) reviewed in EPIC. No concerns.  3. Sexuality, contraception and birth spacing - Patient does not know if want a pregnancy in the next year.  Desired family size is 2 children.  - Reviewed reproductive life planning. Reviewed contraceptive methods based on pt preferences and effectiveness.  Patient desired Female Condom today.   - Discussed birth spacing of 18 months  4. Sleep and fatigue -Encouraged family/partner/community support of 4 hrs of uninterrupted sleep to help with mood and fatigue  5. Physical Recovery  - Discussed patients delivery and complications. She describes her labor as good. - Patient had a Vaginal, no problems at delivery. Perineal healing reviewed. Patient expressed understanding - Patient has urinary incontinence? No. - Patient is safe to resume physical and sexual activity  6.  Health Maintenance -  HM due items addressed Yes - Last pap smear  Diagnosis  Date Value Ref Range Status  01/16/2022   Final   - Negative for intraepithelial lesion or malignancy (NILM)   Pap smear not done at today's visit.  -Breast Cancer screening indicated? No.   7. Chronic Disease/Pregnancy Condition follow up:  h/o DVT and PE.  Still on Lovenox.  Will reach out to hematology for input about dosing and place referral.   Jerene Bears, MD Center  for [facility] Healthcare, [facility]

## 2023-09-23 ENCOUNTER — Encounter (HOSPITAL_BASED_OUTPATIENT_CLINIC_OR_DEPARTMENT_OTHER): Payer: Self-pay | Admitting: Family Medicine

## 2023-09-23 ENCOUNTER — Ambulatory Visit (INDEPENDENT_AMBULATORY_CARE_PROVIDER_SITE_OTHER): Admitting: Family Medicine

## 2023-09-23 VITALS — BP 133/77 | HR 87 | Ht 70.0 in | Wt 269.0 lb

## 2023-09-23 DIAGNOSIS — R519 Headache, unspecified: Secondary | ICD-10-CM | POA: Diagnosis not present

## 2023-09-23 DIAGNOSIS — D509 Iron deficiency anemia, unspecified: Secondary | ICD-10-CM | POA: Insufficient documentation

## 2023-09-23 DIAGNOSIS — E669 Obesity, unspecified: Secondary | ICD-10-CM | POA: Diagnosis not present

## 2023-09-23 DIAGNOSIS — G8929 Other chronic pain: Secondary | ICD-10-CM | POA: Diagnosis not present

## 2023-09-23 MED ORDER — BUTALBITAL-APAP-CAFFEINE 50-325-40 MG PO TABS: 1.0000 | ORAL_TABLET | Freq: Four times a day (QID) | ORAL | 0 refills | Status: DC | PRN

## 2023-09-23 NOTE — Patient Instructions (Addendum)
 Sleep 3 Extended Release Melatonin

## 2023-09-23 NOTE — Progress Notes (Signed)
 New Patient Office Visit  Subjective:   Marissa Hansen 06/22/1991 09/23/2023  Chief Complaint  Patient presents with   New Patient (Initial Visit)    Patient is here today to get established with the practice. Wants to talk about being put on a GLP-1 medication. Also has been having frequent headaches but is 6 weeks postpartum.    HPI: Marissa Hansen is a 33 year old female recently postpartum with history of iron deficiency, vitamin D deficiency, and obesity who presents today to establish care at Primary Care and Sports Medicine at [facility]. Introduced to Publishing rights manager role and practice setting.  All questions answered.    WEIGHT MANAGEMENT: [name] presents for weight management.  Adhering to healthy diet: Yes per patient  Current dietary plan: Optifast- 1 meal a day, protein shakes during the day  Regular exercise regimen:  Walking twice weekly  Medications tried in the past: Wegovy-pt reports she was on this 1 year ago for weight management until becoming pregnant. She is not currently breastfeeding.    Wt Readings from Last 3 Encounters:  09/23/23 269 lb (122 kg)  09/22/23 270 lb (122.5 kg)  08/29/23 275 lb (124.7 kg)    IDA:  [name] presents for the medical management of Anemia.  Current medication :  OTC Iron Supplement - started within the past month in postpartum state  Denies bloody stools, hematuria, excessive fatigue, palpitations, pica.  Patient is not  seeing hematology for management.    Lab Results  Component Value Date   WBC 5.8 08/29/2023   HGB 10.5 (L) 08/29/2023   HCT 33.4 (L) 08/29/2023   MCV 82 08/29/2023   PLT 425 08/29/2023    Lab Results  Component Value Date   IRON 21 (L) 08/29/2023   TIBC 406 08/29/2023    HEADACHE: Reports ongoing chronic headaches that occur more often in morning. She believes headaches may be due to sleep deprivation with newborn as she gave birth on August 13, 2023.  She reports significant decrease in sleep due to feedings.  She does have a supportive husband that helps.  She does use Tylenol and Motrin PM as needed and reports relief when taken.  Patient trying not to use NSAIDs due to chronic Lovenox use due to chronic DVT.  Reports last headache was several days ago .    She denies nausea, vomiting, effect on social functioning, confusion, gait disturbances or fevers.  She does report having mild photophobia when headache occurs.  She is currently asymptomatic at the time of visit.  Denies history of migraine headaches.    The following portions of the patient's history were reviewed and updated as appropriate: past medical history, past surgical history, family history, social history, allergies, medications, and problem list.   Patient Active Problem List   Diagnosis Date Noted   Iron deficiency anemia 09/23/2023   Chronic nonintractable headache 09/23/2023   Gestational hypertension 08/12/2023   IUGR (intrauterine growth restriction) affecting care of mother 06/04/2023   Asthma with acute exacerbation 05/19/2023   Psychosocial stressors-resolved 04/17/2023   History of miscarriage 04/17/2023   History of deep vein thrombosis (DVT) of lower extremity, On Lovenox BID 09/23/2019   HSV-2 seropositive; Start Valtrex at 36 weeks 03/16/2019   Personal history of PE (pulmonary embolism) 12/21/2018   Past Medical History:  Diagnosis Date   Asthma    has not had symptoms in years per pt on 12/04/21   Asthma 10/01/2018  Asthma exacerbation 10/01/2018   COVID 09/2020   no antivirals or infusions, mild symptoms   DVT (deep venous thrombosis) (HCC) 2020   Patient had DVT in her popliteal vein after driving 12 hours to her sister's funeral. Patient had a thrombophilia evaluation that was negative.   Dysrhythmia    PVC's   Hypertension    Insulin resistance    Missed abortion    Palpitations 10/2018   LOV w/ cardiology, 08/14/20 Dr.  Rosemary Holms in Epic. 01/25/21 Echo in Epic, EF 61%.   PCOS (polycystic ovarian syndrome)    PE (pulmonary thromboembolism) (HCC) 2020   bilateral   Plantar fasciitis 06/13/2021   left foot   Polycystic ovarian syndrome 10/01/2018   Past Surgical History:  Procedure Laterality Date   DILATION AND EVACUATION N/A 12/05/2021   Procedure: DILATATION AND EVACUATION;  Surgeon: Jerene Bears, MD;  Location: [facility];  Service: Gynecology;  Laterality: N/A;   WISDOM TOOTH EXTRACTION     around 2005   Family History  Adopted: Yes  Problem Relation Age of Onset   Hypertension Mother    Atrial fibrillation Father    Pulmonary embolism Sister    Diabetes Maternal Aunt    Asthma Neg Hx    Cancer Neg Hx    Heart disease Neg Hx    Social History   Socioeconomic History   Marital status: Single    Spouse name: Not on file   Number of children: Not on file   Years of education: Not on file   Highest education level: Not on file  Occupational History   Not on file  Tobacco Use   Smoking status: Never   Smokeless tobacco: Never  Vaping Use   Vaping status: Never Used  Substance and Sexual Activity   Alcohol use: Not Currently    Comment: very rarely   Drug use: No   Sexual activity: Yes    Birth control/protection: None  Other Topics Concern   Not on file  Social History Narrative   Not on file   Social Drivers of Health   Financial Resource Strain: Low Risk  (01/22/2023)   Overall Financial Resource Strain (CARDIA)    Difficulty of Paying Living Expenses: Not hard at all  Food Insecurity: No Food Insecurity (08/12/2023)   Hunger Vital Sign    Worried About Running Out of Food in the Last Year: Never true    Ran Out of Food in the Last Year: Never true  Transportation Needs: No Transportation Needs (08/12/2023)   PRAPARE - Administrator, Civil Service (Medical): No    Lack of Transportation (Non-Medical): No  Physical Activity: Insufficiently  Active (01/22/2023)   Exercise Vital Sign    Days of Exercise per Week: 3 days    Minutes of Exercise per Session: 30 min  Stress: Stress Concern Present (01/22/2023)   [employer] of Occupational Health - Occupational Stress Questionnaire    Feeling of Stress : Rather much  Social Connections: Socially Integrated (08/12/2023)   Social Connection and Isolation Panel [NHANES]    Frequency of Communication with Friends and Family: Three times a week    Frequency of Social Gatherings with Friends and Family: Once a week    Attends Religious Services: More than 4 times per year    Active Member of [employer] or Organizations: Yes    Attends Banker Meetings: 1 to 4 times per year    Marital Status: Living with partner  Intimate Partner Violence: Not At Risk (08/12/2023)   Humiliation, Afraid, Rape, and Kick questionnaire    Fear of Current or Ex-Partner: No    Emotionally Abused: No    Physically Abused: No    Sexually Abused: No   Outpatient Medications Prior to Visit  Medication Sig Dispense Refill   enoxaparin (LOVENOX) 60 MG/0.6ML injection Inject 60 mg into the skin every 12 (twelve) hours.     ibuprofen (ADVIL) 600 MG tablet Take 1 tablet (600 mg total) by mouth every 6 (six) hours as needed. 30 tablet 0   budesonide-formoterol (SYMBICORT) 80-4.5 MCG/ACT inhaler Inhale 2 puffs into the lungs 2 (two) times daily. 1 each 7   FLUoxetine (PROZAC) 20 MG capsule Take 1 capsule (20 mg total) by mouth daily. (Patient not taking: Reported on 09/23/2023) 90 capsule 1   No facility-administered medications prior to visit.   No Known Allergies  ROS: A complete ROS was performed with pertinent positives/negatives noted in the HPI. The remainder of the ROS are negative.   Objective:   Today's Vitals   09/23/23 1504  BP: 133/77  Pulse: 87  SpO2: 100%  Weight: 269 lb (122 kg)  Height: 5\' 10"  (1.778 m)    GENERAL: Well-appearing, in NAD. Well nourished.  SKIN: Pink, warm and  dry.  Head: Normocephalic. NECK: Trachea midline. Full ROM w/o pain or tenderness.  EARS: Tympanic membranes are intact, translucent without bulging and without drainage. Appropriate landmarks visualized.  EYES: Conjunctiva clear without exudates. EOMI, PERRL, no drainage present.  NOSE: Septum midline w/o deformity. Nares patent, mucosa pink and non-inflamed w/o drainage. No sinus tenderness.  THROAT: Uvula midline. Oropharynx clear.  Mucous membranes pink and moist.  RESPIRATORY: Chest wall symmetrical. Respirations even and non-labored. EXTREMITIES: Without clubbing, cyanosis, or edema.  NEUROLOGIC: No motor or sensory deficits. Steady, even gait. C2-C12 intact.  PSYCH/MENTAL STATUS: Alert, oriented x 3. Cooperative, appropriate mood and affect.       Assessment & Plan:  1. Chronic nonintractable headache, unspecified headache type (Primary) Uncontrolled.  Discussed possible triggers including sleep duration, stress and tension.  Blood pressure well-controlled and postpartum state.  Recent thyroid function was unremarkable.  Due to patient being on Lovenox, recommend avoidance of NSAIDs.  Patient to try sleep 3 for improvement of sleep and trial Fioricet as needed for intermittent headache.  Discussed possible side effects and adverse effects of medication and patient verbalized understanding.  Follow-up in 4 to 6 weeks if headaches are worsening or no improvement.  - butalbital-acetaminophen-caffeine (FIORICET) 50-325-40 MG tablet; Take 1 tablet by mouth every 6 (six) hours as needed for headache.  Dispense: 14 tablet; Refill: 0  2. Iron deficiency anemia, unspecified iron deficiency anemia type Uncontrolled.  Patient has restarted oral OTC iron supplement for replacement.  Denies acute bleeding at this time.  Will check CBC and iron panel with lab work.  Gust good sources of dietary iron as well with patient. - CBC; Future - Iron, TIBC and Ferritin Panel; Future  3. Obesity (BMI  30-39.9) Patient previously did well with GLP-1 with weight loss.  Discussed good healthy habits, exercise and safe use of GLP-1 medication.  Per chart review patient does have mild decreased protein and albumin levels.  We discussed possible risk of protein malnutrition with GLP-1 and loss of non adipose tissue risk if she is not eating regular meals and increasing with strength training.  Will check A1c and CMP prior to starting GLP-1 medication and consider nutrition therapy in addition  to medication.  Patient will return when fasting to complete lab work.  - Hemoglobin A1c; Future - Comprehensive metabolic panel; Future   Patient to reach out to office if new, worrisome, or unresolved symptoms arise or if no improvement in patient's condition. Patient verbalized understanding and is agreeable to treatment plan. All questions answered to patient's satisfaction.    Return in about 3 months (around 12/24/2023) for AE, Follow up Weight.    Hilbert Bible, [place]

## 2023-09-24 ENCOUNTER — Other Ambulatory Visit (HOSPITAL_BASED_OUTPATIENT_CLINIC_OR_DEPARTMENT_OTHER): Payer: Self-pay | Admitting: Family Medicine

## 2023-09-25 ENCOUNTER — Encounter: Payer: Self-pay | Admitting: Family Medicine

## 2023-09-25 LAB — CBC WITH DIFFERENTIAL/PLATELET
Basophils Absolute: 0.1 10*3/uL (ref 0.0–0.2)
Basos: 2 %
EOS (ABSOLUTE): 0.2 10*3/uL (ref 0.0–0.4)
Eos: 3 %
Hematocrit: 36.5 % (ref 34.0–46.6)
Hemoglobin: 11.5 g/dL (ref 11.1–15.9)
Immature Grans (Abs): 0 10*3/uL (ref 0.0–0.1)
Immature Granulocytes: 0 %
Lymphocytes Absolute: 1.5 10*3/uL (ref 0.7–3.1)
Lymphs: 28 %
MCH: 24.7 pg — ABNORMAL LOW (ref 26.6–33.0)
MCHC: 31.5 g/dL (ref 31.5–35.7)
MCV: 78 fL — ABNORMAL LOW (ref 79–97)
Monocytes Absolute: 0.3 10*3/uL (ref 0.1–0.9)
Monocytes: 6 %
Neutrophils Absolute: 3.3 10*3/uL (ref 1.4–7.0)
Neutrophils: 61 %
Platelets: 383 10*3/uL (ref 150–450)
RBC: 4.66 x10E6/uL (ref 3.77–5.28)
RDW: 14.6 % (ref 11.7–15.4)
WBC: 5.4 10*3/uL (ref 3.4–10.8)

## 2023-09-25 LAB — COMPREHENSIVE METABOLIC PANEL
ALT: 24 IU/L (ref 0–32)
AST: 14 IU/L (ref 0–40)
Albumin: 4.2 g/dL (ref 3.9–4.9)
Alkaline Phosphatase: 93 IU/L (ref 44–121)
BUN/Creatinine Ratio: 12 (ref 9–23)
BUN: 12 mg/dL (ref 6–20)
Bilirubin Total: 0.3 mg/dL (ref 0.0–1.2)
CO2: 20 mmol/L (ref 20–29)
Calcium: 8.8 mg/dL (ref 8.7–10.2)
Chloride: 106 mmol/L (ref 96–106)
Creatinine, Ser: 1.02 mg/dL — ABNORMAL HIGH (ref 0.57–1.00)
Globulin, Total: 2.3 g/dL (ref 1.5–4.5)
Glucose: 86 mg/dL (ref 70–99)
Potassium: 4.2 mmol/L (ref 3.5–5.2)
Sodium: 141 mmol/L (ref 134–144)
Total Protein: 6.5 g/dL (ref 6.0–8.5)
eGFR: 74 mL/min/{1.73_m2} (ref >59)

## 2023-09-25 LAB — HEMOGLOBIN A1C
Est. average glucose Bld gHb Est-mCnc: 100 mg/dL
Hgb A1c MFr Bld: 5.1 % (ref 4.8–5.6)

## 2023-09-25 LAB — LAB REPORT - SCANNED
A1c: 5.1
EGFR: 74

## 2023-09-25 LAB — IRON,TIBC AND FERRITIN PANEL
Ferritin: 10 ng/mL — ABNORMAL LOW (ref 15–150)
Iron Saturation: 10 % — ABNORMAL LOW (ref 15–55)
Iron: 43 ug/dL (ref 27–159)
Total Iron Binding Capacity: 417 ug/dL (ref 250–450)
UIBC: 374 ug/dL (ref 131–425)

## 2023-09-26 ENCOUNTER — Encounter (HOSPITAL_BASED_OUTPATIENT_CLINIC_OR_DEPARTMENT_OTHER): Payer: Self-pay | Admitting: Family Medicine

## 2023-09-26 MED ORDER — ELIQUIS 5 MG PO TABS: 5.0000 mg | ORAL_TABLET | Freq: Two times a day (BID) | ORAL | 11 refills | Status: DC

## 2023-09-26 NOTE — Addendum Note (Signed)
 Addended by: Jerene Bears on: 09/26/2023 02:23 PM   Modules accepted: Orders

## 2023-09-26 NOTE — BH Specialist Note (Unsigned)
 Integrated Behavioral Health via Telemedicine Visit  10/08/2023 Marissa Hansen [id number]  Number of Integrated Behavioral Health Clinician visits: 2- Second Visit  Session Start time: 1546   Session End time: 1612  Total time in minutes: 26   Referring Provider: Scheryl Darter, MD Patient/Family location: Home [facility] Provider location: Center for [facility] at [facility]  All persons participating in visit: Patient Marissa Hansen and [facility] Marissa Hansen   Types of Service: Individual psychotherapy and Video visit  I connected with Marissa Hansen and/or Marissa Hansen  n/a  via  Telephone or Video Enabled Telemedicine Application  (Video is Caregility application) and verified that I am speaking with the correct person using two identifiers. Discussed confidentiality: Yes   I discussed the limitations of telemedicine and the availability of in person appointments.  Discussed there is a possibility of technology failure and discussed alternative modes of communication if that failure occurs.  I discussed that engaging in this telemedicine visit, they consent to the provision of behavioral healthcare and the services will be billed under their insurance.  Patient and/or legal guardian expressed understanding and consented to Telemedicine visit: Yes   Presenting Concerns: Patient and/or family reports the following symptoms/concerns: k Duration of problem: Postpartum; Severity of problem: mild  Patient and/or Family's Strengths/Protective Factors: Social connections, Concrete supports in place (healthy food, safe environments, etc.), and Sense of purpose  Goals Addressed: Patient will:  Reduce symptoms of: anxiety and depression   Increase knowledge and/or ability of: healthy habits and self-management skills   Demonstrate ability to: Increase healthy adjustment to current life circumstances  Progress towards  Goals: Achieved  Interventions: Interventions utilized:  Supportive Reflection Standardized Assessments completed: GAD-7 and PHQ 9  Patient and/or Family Response: Patient agrees with treatment plan.   Assessment: Patient currently experiencing Adjustment disorder with mixed depression and anxiety  Patient may benefit from continued therapeutic intervention today  .  Plan: Follow up with behavioral health clinician on : Call Marissa Hansen at [phone], as needed. Behavioral recommendations:  -Continue daily healthy self-care and self-coping strategies as needed  Referral(s): Integrated [employer] (In Clinic)  I discussed the assessment and treatment plan with the patient and/or parent/guardian. They were provided an opportunity to ask questions and all were answered. They agreed with the plan and demonstrated an understanding of the instructions.   They were advised to call back or seek an in-person evaluation if the symptoms worsen or if the condition fails to improve as anticipated.  Marissa Lips, LCSW     10/06/2023    3:54 PM 09/23/2023    3:06 PM 09/18/2023    8:19 AM 07/31/2023    9:45 AM 06/06/2023    9:00 AM  Depression screen PHQ 2/9  Decreased Interest 2 1 1  0 1  Down, Depressed, Hopeless 0 1 1 1 1   PHQ - 2 Score 2 2 2 1 2   Altered sleeping 1 3 3 1 1   Tired, decreased energy 1 3 3 2 2   Change in appetite 0 0 0 2 2  Feeling bad or failure about yourself  0 1 1 0 1  Trouble concentrating 0 0 0 0 0  Moving slowly or fidgety/restless 0 0 0 0 0  Suicidal thoughts 0 0 0 0 0  PHQ-9 Score 4 9 9 6 8   Difficult doing work/chores  Not difficult at all         10/06/2023    3:56 PM 09/23/2023  3:07 PM 09/18/2023    8:21 AM 07/31/2023    9:45 AM  GAD 7 : Generalized Anxiety Score  Nervous, Anxious, on Edge 0 3 3 1   Control/stop worrying 0 3 3 1   Worry too much - different things 0 3 3 1   Trouble relaxing 0 1 1 0  Restless 0 0 0 0  Easily annoyed or  irritable 0 3 3 1   Afraid - awful might happen 1 1 1 1   Total GAD 7 Score 1 14 14 5   Anxiety Difficulty  Not difficult at all

## 2023-09-29 ENCOUNTER — Encounter (HOSPITAL_BASED_OUTPATIENT_CLINIC_OR_DEPARTMENT_OTHER): Payer: Self-pay | Admitting: Family Medicine

## 2023-09-29 NOTE — Progress Notes (Signed)
 If patient does not reply to my chart message, please call her within 1 to 2 days  Hi Marissa Hansen, Your blood counts and anemia has been improving with taking the oral iron replacement.  Please continue on this.  However, your kidney function has been declining since January from what I can see for your last labs.  I am concerned about a possible subacute kidney injury that is ongoing.  I do not see where this is being caused by any of your medication changes since birth, but I would like to repeat your kidney function and also get a urine sample from you and possibly an ultrasound of the kidneys to see what may be going on.  Please increase your clear fluids, monitor your protein intake, and lets repeat your labs this week or next with a urine sample if possible.  Please let me know if you are agreeable to do this

## 2023-09-30 ENCOUNTER — Other Ambulatory Visit: Payer: Self-pay | Admitting: Family

## 2023-09-30 DIAGNOSIS — D6859 Other primary thrombophilia: Secondary | ICD-10-CM

## 2023-09-30 DIAGNOSIS — Z86711 Personal history of pulmonary embolism: Secondary | ICD-10-CM

## 2023-10-01 ENCOUNTER — Encounter: Payer: Self-pay | Admitting: Family

## 2023-10-01 ENCOUNTER — Inpatient Hospital Stay (HOSPITAL_BASED_OUTPATIENT_CLINIC_OR_DEPARTMENT_OTHER): Admitting: Family

## 2023-10-01 ENCOUNTER — Other Ambulatory Visit (HOSPITAL_BASED_OUTPATIENT_CLINIC_OR_DEPARTMENT_OTHER): Payer: Self-pay | Admitting: Family Medicine

## 2023-10-01 ENCOUNTER — Inpatient Hospital Stay: Attending: Hematology & Oncology

## 2023-10-01 VITALS — BP 123/64 | HR 74 | Temp 98.8°F | Resp 18 | Wt 269.0 lb

## 2023-10-01 DIAGNOSIS — Z86711 Personal history of pulmonary embolism: Secondary | ICD-10-CM | POA: Diagnosis present

## 2023-10-01 DIAGNOSIS — Z86718 Personal history of other venous thrombosis and embolism: Secondary | ICD-10-CM

## 2023-10-01 DIAGNOSIS — N289 Disorder of kidney and ureter, unspecified: Secondary | ICD-10-CM

## 2023-10-01 DIAGNOSIS — D6859 Other primary thrombophilia: Secondary | ICD-10-CM

## 2023-10-01 DIAGNOSIS — Z7901 Long term (current) use of anticoagulants: Secondary | ICD-10-CM

## 2023-10-01 DIAGNOSIS — D509 Iron deficiency anemia, unspecified: Secondary | ICD-10-CM

## 2023-10-01 LAB — ANTITHROMBIN III: AntiThromb III Func: 111 % (ref 75–120)

## 2023-10-01 NOTE — Progress Notes (Signed)
 Orders are placed for renal function panel and urinalysis.  Please call patient to schedule lab appointment within the next week.

## 2023-10-01 NOTE — Progress Notes (Unsigned)
 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  She states that she had driven Marissa Hansen to [facility] and back in 04/17/18. Marissa Hansen passed away from a PE in Sep 16, 2018 and then the patient was diagnosed with Bilateral PE and DVT a month later. Neither had prior history of thrombotic event.  No illness or injury at that time but she thinks that she was on an estrogen based birth control for PCOS.  She was treated with Eliquis and had dropped to 2.5 mg PO BID prior to pregnancy.   She also has history of miscarriage of twins in 11/2022.  She gave birth to a healthy baby boy on 08/13/2023.  She was on Lovenox during Marissa pregnancy.  Factor V was negative.  Marissa Hansen had recent brain MRI that showed mini stroke vs [name] is a smoker.  No past history of surgery per patient.  No history of diabetes or thyroid disease.  No personal or known familial history of cancer.  No fever, chills, n/v, cough, rash, dizziness, SOB, chest pain, palpitations, abdominal pain or changes in bowel or bladder habits.  She had had previous palpitations but none since having Marissa baby.   No swelling, numbness or tingling in Marissa extremities.  No falls or syncope.  No smoking, ETOH or recreational drug use.  Appetite and hydration are good. Weight is stable at 269 lbs.  She had lost 100 lbs on Wegovy.  She works as a International aid/development worker.   ROS: All other 10 point review of systems is negative.   PAST MEDICAL HISTORY:   Past Medical History:  Diagnosis Date   Asthma    has not had symptoms in years per pt on  12/04/21   Asthma 10/01/2018   Asthma exacerbation 10/01/2018   COVID 09/2020   no antivirals or infusions, mild symptoms   DVT (deep venous thrombosis) (HCC) 2020   Patient had DVT in Marissa popliteal vein after driving 12 hours to Marissa Hansen's funeral. Patient had a thrombophilia evaluation that was negative.   Dysrhythmia    PVC's   Hypertension    Insulin resistance    Missed abortion    Palpitations 10/2018   LOV w/ cardiology, 08/14/20 Dr. Rosemary Hansen in Epic. 01/25/21 Echo in Epic, EF 61%.   PCOS (polycystic ovarian syndrome)    PE (pulmonary thromboembolism) (HCC) 2020   bilateral   Plantar fasciitis 06/13/2021   left foot   Polycystic ovarian syndrome 10/01/2018    ALLERGIES: No Known Allergies    MEDICATIONS:  Current Outpatient Medications on File Prior to Visit  Medication Sig Dispense Refill   butalbital-acetaminophen-caffeine (FIORICET) 50-325-40 MG tablet Take 1 tablet by mouth every 6 (six) hours as needed for headache. 14 tablet 0   ELIQUIS 5 MG TABS tablet Take 1 tablet (5 mg total) by mouth 2 (two) times daily. 60 tablet 11   ibuprofen (ADVIL) 600 MG tablet Take 1 tablet (600 mg total) by mouth every 6 (six) hours as needed. 30 tablet 0   No  current facility-administered medications on file prior to visit.     PAST SURGICAL HISTORY Past Surgical History:  Procedure Laterality Date   DILATION AND EVACUATION N/A 12/05/2021   Procedure: DILATATION AND EVACUATION;  Surgeon: Jerene Bears, MD;  Location: [facility];  Service: Gynecology;  Laterality: N/A;   WISDOM TOOTH EXTRACTION     around 2005    FAMILY HISTORY: Family History  Adopted: Yes  Problem Relation Age of Onset   Hypertension Marissa Hansen    Atrial fibrillation Marissa Hansen    Pulmonary embolism Hansen    Diabetes Maternal Aunt    Asthma Neg Hx    Cancer Neg Hx    Heart disease Neg Hx     SOCIAL HISTORY:  reports that she has never smoked. She has never used smokeless tobacco. She  reports that she does not currently use alcohol. She reports that she does not use drugs.  PERFORMANCE STATUS: The patient's performance status is 0 - Asymptomatic  PHYSICAL EXAM: Most Recent Vital Signs: not currently breastfeeding. There were no vitals taken for this visit.  General Appearance:    Alert, cooperative, no distress, appears stated age  Head:    Normocephalic, without obvious abnormality, atraumatic  Eyes:    PERRL, conjunctiva/corneas clear, EOM's intact, fundi    benign, both eyes        Throat:   Lips, mucosa, and tongue normal; teeth and gums normal  Neck:   Supple, symmetrical, trachea midline, no adenopathy;    thyroid:  no enlargement/tenderness/nodules; no carotid   bruit or JVD  Back:     Symmetric, no curvature, ROM normal, no CVA tenderness  Lungs:     Clear to auscultation bilaterally, respirations unlabored  Chest Wall:    No tenderness or deformity   Heart:    Regular rate and rhythm, S1 and S2 normal, no murmur, rub   or gallop     Abdomen:     Soft, non-tender, bowel sounds active all four quadrants,    no masses, no organomegaly        Extremities:   Extremities normal, atraumatic, no cyanosis or edema  Pulses:   2+ and symmetric all extremities  Skin:   Skin color, texture, turgor normal, no rashes or lesions  Lymph nodes:   Cervical, supraclavicular, and axillary nodes normal  Neurologic:   CNII-XII intact, normal strength, sensation and reflexes    throughout    LABORATORY DATA:  No results found for this or any previous visit (from the past 48 hours).    RADIOGRAPHY: No results found.     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  Marissa protein S activity was low at 46.  Once she completes this months Eliquis prescription we will have Marissa reduce Marissa dose back down to 2.5 mg PO BID maintenance.   It is our recommendation with Marissa history of DVT, PE, miscarriage and low protein S activity that the patient remain on maintenance anticoagulation lifelong.  Follow-up in 3 months.   All questions were answered. The patient knows to call the clinic with any problems, questions or concerns. We can certainly see the patient much sooner if necessary.  The patient was discussed with Dr. Myna Hidalgo and he is in agreement with the aforementioned.   Eileen Stanford, NP

## 2023-10-02 LAB — LUPUS ANTICOAGULANT PANEL
DRVVT: 47 s (ref 0.0–47.0)
PTT Lupus Anticoagulant: 30.6 s (ref 0.0–43.5)

## 2023-10-02 LAB — CARDIOLIPIN ANTIBODIES, IGG, IGM, IGA
Anticardiolipin IgA: <9 U/mL (ref 0–11)
Anticardiolipin IgG: <9 GPL U/mL (ref 0–14)
Anticardiolipin IgM: <9 [MPL'U]/mL (ref 0–12)

## 2023-10-02 LAB — PROTEIN S, TOTAL: Protein S Ag, Total: 66 % (ref 60–150)

## 2023-10-02 LAB — PROTEIN C ACTIVITY: Protein C Activity: 110 % (ref 73–180)

## 2023-10-02 LAB — PROTEIN S ACTIVITY: Protein S Activity: 46 % — ABNORMAL LOW (ref 63–140)

## 2023-10-03 ENCOUNTER — Other Ambulatory Visit (HOSPITAL_BASED_OUTPATIENT_CLINIC_OR_DEPARTMENT_OTHER): Payer: Self-pay | Admitting: Family Medicine

## 2023-10-03 LAB — BETA-2-GLYCOPROTEIN I ABS, IGG/M/A
Beta-2 Glyco I IgG: <9 GPI IgG units (ref 0–20)
Beta-2-Glycoprotein I IgA: <9 GPI IgA units (ref 0–25)
Beta-2-Glycoprotein I IgM: <9 GPI IgM units (ref 0–32)

## 2023-10-03 LAB — PROTEIN C, TOTAL: Protein C, Total: 121 % (ref 60–150)

## 2023-10-03 LAB — HOMOCYSTEINE: Homocysteine: 9.7 umol/L (ref 0.0–14.5)

## 2023-10-04 LAB — URINALYSIS, ROUTINE W REFLEX MICROSCOPIC
Bilirubin, UA: NEGATIVE
Glucose, UA: NEGATIVE
Ketones, UA: NEGATIVE
Leukocytes,UA: NEGATIVE
Nitrite, UA: NEGATIVE
Protein,UA: NEGATIVE
RBC, UA: NEGATIVE
Specific Gravity, UA: 1.008 (ref 1.005–1.030)
Urobilinogen, Ur: 0.2 mg/dL (ref 0.2–1.0)
pH, UA: 6.5 (ref 5.0–7.5)

## 2023-10-04 LAB — RENAL FUNCTION PANEL
Albumin: 3.9 g/dL (ref 3.9–4.9)
BUN/Creatinine Ratio: 16 (ref 9–23)
BUN: 13 mg/dL (ref 6–20)
CO2: 21 mmol/L (ref 20–29)
Calcium: 8.3 mg/dL — ABNORMAL LOW (ref 8.7–10.2)
Chloride: 105 mmol/L (ref 96–106)
Creatinine, Ser: 0.79 mg/dL (ref 0.57–1.00)
Glucose: 81 mg/dL (ref 70–99)
Phosphorus: 3.1 mg/dL (ref 3.0–4.3)
Potassium: 4 mmol/L (ref 3.5–5.2)
Sodium: 140 mmol/L (ref 134–144)
eGFR: 100 mL/min/{1.73_m2} (ref >59)

## 2023-10-06 ENCOUNTER — Ambulatory Visit: Payer: Self-pay | Admitting: Clinical

## 2023-10-06 ENCOUNTER — Other Ambulatory Visit (HOSPITAL_BASED_OUTPATIENT_CLINIC_OR_DEPARTMENT_OTHER): Payer: Self-pay | Admitting: Family Medicine

## 2023-10-06 ENCOUNTER — Encounter (HOSPITAL_BASED_OUTPATIENT_CLINIC_OR_DEPARTMENT_OTHER): Payer: Self-pay | Admitting: Family Medicine

## 2023-10-06 DIAGNOSIS — E669 Obesity, unspecified: Secondary | ICD-10-CM

## 2023-10-06 DIAGNOSIS — F4323 Adjustment disorder with mixed anxiety and depressed mood: Secondary | ICD-10-CM

## 2023-10-06 MED ORDER — ZEPBOUND 2.5 MG/0.5ML ~~LOC~~ SOAJ: 2.5000 mg | SUBCUTANEOUS | 1 refills | Status: DC

## 2023-10-06 NOTE — Telephone Encounter (Signed)
 Please see mychart message sent by pt and advise.

## 2023-10-06 NOTE — Progress Notes (Signed)
 Hi Marissa Hansen,  Your renal function has returned to baseline. This may have been due to slight dehydration. I would continue to increase clear fluids, monitor your protein intake and we can recheck at your physical in June. Please call the office to schedule. Thank you.

## 2023-10-07 ENCOUNTER — Other Ambulatory Visit (HOSPITAL_BASED_OUTPATIENT_CLINIC_OR_DEPARTMENT_OTHER): Payer: Self-pay | Admitting: Family Medicine

## 2023-10-07 DIAGNOSIS — E669 Obesity, unspecified: Secondary | ICD-10-CM

## 2023-10-07 LAB — PROTHROMBIN GENE MUTATION

## 2023-10-08 ENCOUNTER — Other Ambulatory Visit: Payer: Self-pay | Admitting: Family

## 2023-10-08 ENCOUNTER — Ambulatory Visit (HOSPITAL_BASED_OUTPATIENT_CLINIC_OR_DEPARTMENT_OTHER): Payer: Self-pay | Admitting: Family Medicine

## 2023-10-08 DIAGNOSIS — Z86718 Personal history of other venous thrombosis and embolism: Secondary | ICD-10-CM

## 2023-10-08 DIAGNOSIS — D6859 Other primary thrombophilia: Secondary | ICD-10-CM

## 2023-10-08 DIAGNOSIS — Z86711 Personal history of pulmonary embolism: Secondary | ICD-10-CM

## 2023-10-08 MED ORDER — APIXABAN 2.5 MG PO TABS
2.5000 mg | ORAL_TABLET | Freq: Two times a day (BID) | ORAL | 6 refills | Status: AC
Start: 2023-10-08 — End: ?

## 2023-10-08 NOTE — Patient Instructions (Signed)
 Center for [facility] Healthcare at [facility] Women [address] [place], [place] [address] [phone] (main office) [phone] [facility] office)  New Parent Support Groups [email or web address]

## 2023-10-24 NOTE — Telephone Encounter (Signed)
 New mychart sent by pt:  Marissa Hansen D[facility]-Primary Care Clinical (supporting Shelton Silvas [place], [place] hours ago (7:28 PM)    Hello,    I purchased the zepbound without insurance and have not seen much of a difference. Could we increase to the 5mg ?    Thanks    Alexis, please advise.

## 2023-10-27 ENCOUNTER — Other Ambulatory Visit (HOSPITAL_BASED_OUTPATIENT_CLINIC_OR_DEPARTMENT_OTHER): Payer: Self-pay | Admitting: Family Medicine

## 2023-10-27 DIAGNOSIS — E669 Obesity, unspecified: Secondary | ICD-10-CM

## 2023-10-27 MED ORDER — TIRZEPATIDE-WEIGHT MANAGEMENT 5 MG/0.5ML ~~LOC~~ SOLN: 5.0000 mg | SUBCUTANEOUS | 2 refills | Status: DC

## 2023-10-27 MED ORDER — ZEPBOUND 5 MG/0.5ML ~~LOC~~ SOAJ: 5.0000 mg | SUBCUTANEOUS | 3 refills | Status: DC

## 2023-12-27 ENCOUNTER — Encounter (HOSPITAL_BASED_OUTPATIENT_CLINIC_OR_DEPARTMENT_OTHER): Payer: Self-pay | Admitting: Family Medicine

## 2024-01-02 ENCOUNTER — Encounter (HOSPITAL_BASED_OUTPATIENT_CLINIC_OR_DEPARTMENT_OTHER): Payer: Self-pay | Admitting: Obstetrics & Gynecology

## 2024-01-02 ENCOUNTER — Inpatient Hospital Stay: Payer: Self-pay | Admitting: Family

## 2024-01-02 ENCOUNTER — Inpatient Hospital Stay: Payer: Self-pay | Attending: Family

## 2024-01-06 ENCOUNTER — Other Ambulatory Visit (HOSPITAL_BASED_OUTPATIENT_CLINIC_OR_DEPARTMENT_OTHER): Payer: Self-pay | Admitting: Certified Nurse Midwife

## 2024-01-06 DIAGNOSIS — N926 Irregular menstruation, unspecified: Secondary | ICD-10-CM

## 2024-01-06 NOTE — Telephone Encounter (Signed)
 Pt sent a mychart message requesting to have the zepbound  dose increased. Pt has been made aware that Thersia is out of the office until 6/30. Routing encounter to [place] for her to review when she returns to the office.

## 2024-01-12 ENCOUNTER — Other Ambulatory Visit (HOSPITAL_BASED_OUTPATIENT_CLINIC_OR_DEPARTMENT_OTHER): Payer: Self-pay | Admitting: Family Medicine

## 2024-01-12 DIAGNOSIS — E669 Obesity, unspecified: Secondary | ICD-10-CM

## 2024-01-12 MED ORDER — TIRZEPATIDE-WEIGHT MANAGEMENT 7.5 MG/0.5ML ~~LOC~~ SOLN: 7.5000 mg | SUBCUTANEOUS | 2 refills | Status: DC

## 2024-01-21 HISTORY — PX: OTHER SURGICAL HISTORY: SHX169

## 2024-01-23 NOTE — Telephone Encounter (Signed)
 Called pt.  She went to local ER on 7/9 and had ruptured ectopic pregnancy.  She didn't know she was pregnant.  Laparoscopy with salpingectomy was done.  She is now home and doing well.  Was having a lot of weakness and feeling really hungry and now knows why.  Is going to start POP and has prescription.  She is going to have follow up appointment with surgeon on 7/24.  Questions answered.  Voiced appreciation for phone call.

## 2024-01-26 ENCOUNTER — Ambulatory Visit (HOSPITAL_BASED_OUTPATIENT_CLINIC_OR_DEPARTMENT_OTHER): Payer: Self-pay | Admitting: Obstetrics & Gynecology

## 2024-02-11 ENCOUNTER — Encounter (HOSPITAL_BASED_OUTPATIENT_CLINIC_OR_DEPARTMENT_OTHER): Payer: Self-pay | Admitting: Certified Nurse Midwife

## 2024-02-12 ENCOUNTER — Other Ambulatory Visit (HOSPITAL_BASED_OUTPATIENT_CLINIC_OR_DEPARTMENT_OTHER): Payer: Self-pay | Admitting: Certified Nurse Midwife

## 2024-02-12 ENCOUNTER — Other Ambulatory Visit (HOSPITAL_BASED_OUTPATIENT_CLINIC_OR_DEPARTMENT_OTHER): Payer: Self-pay | Admitting: Obstetrics & Gynecology

## 2024-02-12 MED ORDER — FLUOXETINE HCL 20 MG PO CAPS: 20.0000 mg | ORAL_CAPSULE | Freq: Two times a day (BID) | ORAL | 3 refills | Status: DC

## 2024-03-05 ENCOUNTER — Other Ambulatory Visit (HOSPITAL_BASED_OUTPATIENT_CLINIC_OR_DEPARTMENT_OTHER): Payer: Self-pay | Admitting: Certified Nurse Midwife

## 2024-03-18 ENCOUNTER — Encounter (HOSPITAL_BASED_OUTPATIENT_CLINIC_OR_DEPARTMENT_OTHER): Payer: Self-pay | Admitting: Family Medicine

## 2024-03-18 ENCOUNTER — Ambulatory Visit (HOSPITAL_BASED_OUTPATIENT_CLINIC_OR_DEPARTMENT_OTHER): Admitting: Family Medicine

## 2024-03-18 ENCOUNTER — Encounter (HOSPITAL_BASED_OUTPATIENT_CLINIC_OR_DEPARTMENT_OTHER): Payer: Self-pay

## 2024-03-18 ENCOUNTER — Other Ambulatory Visit (HOSPITAL_BASED_OUTPATIENT_CLINIC_OR_DEPARTMENT_OTHER): Payer: Self-pay | Admitting: Family Medicine

## 2024-03-18 VITALS — BP 131/77 | HR 75 | Ht 70.0 in | Wt 277.0 lb

## 2024-03-18 DIAGNOSIS — R519 Headache, unspecified: Secondary | ICD-10-CM

## 2024-03-18 DIAGNOSIS — D62 Acute posthemorrhagic anemia: Secondary | ICD-10-CM | POA: Diagnosis not present

## 2024-03-18 DIAGNOSIS — Z8759 Personal history of other complications of pregnancy, childbirth and the puerperium: Secondary | ICD-10-CM | POA: Diagnosis not present

## 2024-03-18 DIAGNOSIS — G8929 Other chronic pain: Secondary | ICD-10-CM

## 2024-03-18 DIAGNOSIS — E669 Obesity, unspecified: Secondary | ICD-10-CM

## 2024-03-18 DIAGNOSIS — F411 Generalized anxiety disorder: Secondary | ICD-10-CM

## 2024-03-18 LAB — CBC WITH DIFFERENTIAL/PLATELET
Basophils Absolute: 0.1 x10E3/uL (ref 0.0–0.2)
Basos: 2 %
EOS (ABSOLUTE): 0.4 x10E3/uL (ref 0.0–0.4)
Eos: 5 %
Hematocrit: 33.4 % — ABNORMAL LOW (ref 34.0–46.6)
Hemoglobin: 9.7 g/dL — ABNORMAL LOW (ref 11.1–15.9)
Immature Grans (Abs): 0 x10E3/uL (ref 0.0–0.1)
Immature Granulocytes: 0 %
Lymphocytes Absolute: 2.4 x10E3/uL (ref 0.7–3.1)
Lymphs: 36 %
MCH: 21.1 pg — ABNORMAL LOW (ref 26.6–33.0)
MCHC: 29 g/dL — ABNORMAL LOW (ref 31.5–35.7)
MCV: 73 fL — ABNORMAL LOW (ref 79–97)
Monocytes Absolute: 0.5 x10E3/uL (ref 0.1–0.9)
Monocytes: 7 %
Neutrophils Absolute: 3.2 x10E3/uL (ref 1.4–7.0)
Neutrophils: 50 %
Platelets: 531 x10E3/uL — ABNORMAL HIGH (ref 150–450)
RBC: 4.59 x10E6/uL (ref 3.77–5.28)
RDW: 16.2 % — ABNORMAL HIGH (ref 11.7–15.4)
WBC: 6.6 x10E3/uL (ref 3.4–10.8)

## 2024-03-18 MED ORDER — ZEPBOUND 10 MG/0.5ML ~~LOC~~ SOAJ: 10.0000 mg | SUBCUTANEOUS | 3 refills | Status: DC

## 2024-03-18 MED ORDER — FLUOXETINE HCL 10 MG PO CAPS: ORAL_CAPSULE | ORAL | 0 refills | Status: AC

## 2024-03-18 MED ORDER — TIRZEPATIDE-WEIGHT MANAGEMENT 10 MG/0.5ML ~~LOC~~ SOLN: 10.0000 mg | SUBCUTANEOUS | 3 refills | Status: DC

## 2024-03-18 MED ORDER — BUTALBITAL-APAP-CAFFEINE 50-325-40 MG PO TABS: 1.0000 | ORAL_TABLET | Freq: Four times a day (QID) | ORAL | 0 refills | Status: AC | PRN

## 2024-03-18 MED ORDER — BUPROPION HCL ER (XL) 150 MG PO TB24: 150.0000 mg | ORAL_TABLET | Freq: Every day | ORAL | 3 refills | Status: DC

## 2024-03-18 NOTE — Progress Notes (Signed)
 Subjective:   Marissa Hansen Dec 22, 1990 03/18/2024  Chief Complaint  Patient presents with   Medical Management of Chronic Issues    Pt is here following up after beginning zepbound . States she is not seeing the weight loss that she was wanting to after beginning zepbound  compared to what she saw when taking wegovy.     HPI: Marissa Hansen presents today for re-assessment and management of chronic medical conditions.   S/p ectopic pregnancy  Patient is s/p right ruptured tubal ectopic pregnancy in July 2025. Patient was unaware she was pregnant at the time of pain onset. She underwent removal of ectopic pregnancy and right salpingectomy on 01/21/2024. She states menstrual cycle has resumed since ectopic. She is not currently desiring pregnancy. Currently not on any form of birth control.    WEIGHT LOSS:  Reports last dosage of Zepbound  was Tues/Wed. She states she is attempting to make dietary changes but struggles with eating out daily. She is trying to increase exercise routine. She would like to try increased dosage of Zepbound  before switching to [facility] as she feels she had improved outcome of weight loss with Tzhncb. She is wondering Fluoxetine  is contributing to her weight.    Wt Readings from Last 3 Encounters:  03/18/24 277 lb (125.6 kg)  10/01/23 269 lb (122 kg)  09/23/23 269 lb (122 kg)    ANXIETY:  Patient is concerned her fluoxetine  use is contributing to difficulty losing weight. She would like to try Wellbutrin  to help with anxiety without concern of weight gain while making efforts for weight loss.Reports anxiety is currently well controlled. Denies panic like symptoms.       03/18/2024    3:30 PM 10/06/2023    3:56 PM 09/23/2023    3:07 PM 09/18/2023    8:21 AM  GAD 7 : Generalized Anxiety Score  Nervous, Anxious, on Edge 0 0 3 3  Control/stop worrying 0 0 3 3  Worry too much - different things 0 0 3 3  Trouble relaxing 0 0 1 1  Restless 0 0 0 0   Easily annoyed or irritable 0 0 3 3  Afraid - awful might happen 0 1 1 1   Total GAD 7 Score 0 1 14 14   Anxiety Difficulty Not difficult at all  Not difficult at all        03/18/2024    3:30 PM 10/06/2023    3:54 PM 09/23/2023    3:06 PM 09/18/2023    8:19 AM 07/31/2023    9:45 AM  Depression screen PHQ 2/9  Decreased Interest 0 2 1 1  0  Down, Depressed, Hopeless 0 0 1 1 1   PHQ - 2 Score 0 2 2 2 1   Altered sleeping 0 1 3 3 1   Tired, decreased energy 0 1 3 3 2   Change in appetite 0 0 0 0 2  Feeling bad or failure about yourself  0 0 1 1 0  Trouble concentrating 0 0 0 0 0  Moving slowly or fidgety/restless 0 0 0 0 0  Suicidal thoughts 0 0 0 0 0  PHQ-9 Score 0 4 9 9 6   Difficult doing work/chores Not difficult at all  Not difficult at all      The following portions of the patient's history were reviewed and updated as appropriate: past medical history, past surgical history, family history, social history, allergies, medications, and problem list.   Patient Active Problem List   Diagnosis Date Noted  S/P ectopic pregnancy 03/21/2024   Obesity (BMI 30-39.9) 03/21/2024   GAD (generalized anxiety disorder) 03/21/2024   Iron deficiency anemia 09/23/2023   Chronic nonintractable headache 09/23/2023   Gestational hypertension 08/12/2023   IUGR (intrauterine growth restriction) affecting care of mother 06/04/2023   Asthma with acute exacerbation 05/19/2023   Psychosocial stressors-resolved 04/17/2023   History of miscarriage 04/17/2023   History of deep vein thrombosis (DVT) of lower extremity, On Lovenox  BID 09/23/2019   HSV-2 seropositive; Start Valtrex  at 36 weeks 03/16/2019   Personal history of PE (pulmonary embolism) 12/21/2018   Past Medical History:  Diagnosis Date   Asthma    has not had symptoms in years per pt on 12/04/21   Asthma 10/01/2018   Asthma exacerbation 10/01/2018   COVID 09/2020   no antivirals or infusions, mild symptoms   DVT (deep venous thrombosis)  (HCC) 2020   Patient had DVT in her popliteal vein after driving 12 hours to her sister's funeral. Patient had a thrombophilia evaluation that was negative.   Dysrhythmia    PVC's   Hypertension    Insulin resistance    Missed abortion    Palpitations 10/2018   LOV w/ cardiology, 08/14/20 Dr. Elmira in Epic. 01/25/21 Echo in Epic, EF 61%.   PCOS (polycystic ovarian syndrome)    PE (pulmonary thromboembolism) (HCC) 2020   bilateral   Plantar fasciitis 06/13/2021   left foot   Polycystic ovarian syndrome 10/01/2018   Past Surgical History:  Procedure Laterality Date   DILATION AND EVACUATION N/A 12/05/2021   Procedure: DILATATION AND EVACUATION;  Surgeon: Cleotilde Ronal RAMAN, MD;  [facility];  Service: Gynecology;  Laterality: N/A;   REMOVAL ECTOPIC PREGNANCY LAPAROSCOPIC, RIGHT LAPAROSCOPIC SALPINGECTOMY Right 01/21/2024   WISDOM TOOTH EXTRACTION     around 2005   Family History  Adopted: Yes  Problem Relation Age of Onset   Hypertension Mother    Atrial fibrillation Father    Pulmonary embolism Sister    Diabetes Maternal Aunt    Asthma Neg Hx    Cancer Neg Hx    Heart disease Neg Hx    Outpatient Medications Prior to Visit  Medication Sig Dispense Refill   apixaban  (ELIQUIS ) 2.5 MG TABS tablet Take 1 tablet (2.5 mg total) by mouth 2 (two) times daily. 60 tablet 6   ibuprofen  (ADVIL ) 600 MG tablet Take 1 tablet (600 mg total) by mouth every 6 (six) hours as needed. 30 tablet 0   butalbital -acetaminophen -caffeine  (FIORICET) 50-325-40 MG tablet Take 1 tablet by mouth every 6 (six) hours as needed for headache. 14 tablet 0   FLUoxetine  (PROZAC ) 20 MG capsule TAKE 1 CAPSULE BY MOUTH EVERY DAY 90 capsule 1   tirzepatide  7.5 MG/0.5ML injection vial Inject 7.5 mg into the skin once a week. 4 mL 2   No facility-administered medications prior to visit.   No Known Allergies   ROS: A complete ROS was performed with pertinent positives/negatives noted in the  HPI. The remainder of the ROS are negative.    Objective:   Today's Vitals   03/18/24 1526  BP: 131/77  Pulse: 75  SpO2: 100%  Weight: 277 lb (125.6 kg)  Height: 5' 10 (1.778 m)    Physical Exam   GENERAL: Well-appearing, in NAD. Well nourished.  SKIN: Pink, warm and dry.   Head: Normocephalic. NECK: Trachea midline. Full ROM w/o pain or tenderness.  RESPIRATORY: Chest wall symmetrical. Respirations even and non-labored. Breath sounds clear to auscultation bilaterally.  CARDIAC: S1, S2 present, regular rate and rhythm without murmur or gallops. Peripheral pulses 2+ bilaterally.  MSK: Muscle tone and strength appropriate for age. NEUROLOGIC: No motor or sensory deficits. Steady, even gait. C2-C12 intact.  PSYCH/MENTAL STATUS: Alert, oriented x 3. Cooperative, appropriate mood and affect.   Health Maintenance Due  Topic Date Due   HPV VACCINES (2 - 3-dose series) 10/18/2014   Influenza Vaccine  02/13/2024   COVID-19 Vaccine (4 - 2025-26 season) 03/15/2024       Assessment & Plan:  1. Chronic nonintractable headache, unspecified headache type Currently well controlled. Pt requesting refill of Fioricet. Safe use of medication reviewed.  - butalbital -acetaminophen -caffeine  (FIORICET) 50-325-40 MG tablet; Take 1 tablet by mouth every 6 (six) hours as needed for headache.  Dispense: 14 tablet; Refill: 0  2. Acute blood loss anemia (Primary) Hx of anemia. Will check CBC with labs today and likely start iron replacement.  - CBC with Differential/Platelet  3. S/P ectopic pregnancy Patient healing well currently without complications. Will follow up with OBGYN. Recommend form of birth control if not desiring conception at this time.   4. Obesity (BMI 30-39.9) Discussed nutritoinal and exercise guidance to assist in weight loss. Will increase Zepbound  10mg  for at least 1-2 months with lifestyle change.   5. GAD (generalized anxiety disorder) Controlled currently. Per patient  preference, will slowly wean from Fluoxetine  with weaning schedule provided. Will start Wellbutrin  in last 1-2 weeks of wean for anxiety and concern of weight gain with SSRIs. Safe use of medication reviewed with patient.     Meds ordered this encounter  Medications   FLUoxetine  (PROZAC ) 10 MG capsule    Sig: Take 1 capsule (10 mg total) by mouth daily for 14 days, THEN 1 capsule (10 mg total) every other day for 14 days.    Dispense:  21 capsule    Refill:  0    Supervising Provider:   DE PERU, RAYMOND J [[id number]]   DISCONTD: tirzepatide  10 MG/0.5ML injection vial    Sig: Inject 10 mg into the skin once a week.    Dispense:  2 mL    Refill:  3    Supervising Provider:   DE PERU, RAYMOND J [[id number]]   butalbital -acetaminophen -caffeine  (FIORICET) 50-325-40 MG tablet    Sig: Take 1 tablet by mouth every 6 (six) hours as needed for headache.    Dispense:  14 tablet    Refill:  0    Supervising Provider:   DE PERU, RAYMOND J [[id number]]   buPROPion  (WELLBUTRIN  XL) 150 MG 24 hr tablet    Sig: Take 1 tablet (150 mg total) by mouth daily.    Dispense:  30 tablet    Refill:  3    Supervising Provider:   DE PERU, RAYMOND J [id number]   tirzepatide  (ZEPBOUND ) 10 MG/0.5ML Pen    Sig: Inject 10 mg into the skin once a week.    Dispense:  2 mL    Refill:  3    Supervising Provider:   DE PERU, RAYMOND J [[id number]]   Lab Orders         CBC with Differential/Platelet      Return in about 2 months (around 05/18/2024) for ANNUAL PHYSICAL, follow up weight and mental health .    Patient to reach out to office if new, worrisome, or unresolved symptoms arise or if no improvement in patient's condition. Patient verbalized understanding and is agreeable to treatment plan. All questions answered to patient's satisfaction.  Thersia Schuyler Stark, [place]

## 2024-03-18 NOTE — Patient Instructions (Signed)
 Fluoxetine  Weaning:  Take 1 capsule (20mg ) daily for 14 days.  THEN 1 capsule (10mg ) daily for 14 days.  THEN 1 capsule every other day for 14 days AND STOP.   You can start the Wellbutrin  when you have weaned from the fluoxetine . If you would like to start it the last 1-2 weeks of weaning, that would be fine as well.    Zepbound :  Start 10mg  once weekly.

## 2024-03-21 ENCOUNTER — Ambulatory Visit (HOSPITAL_BASED_OUTPATIENT_CLINIC_OR_DEPARTMENT_OTHER): Payer: Self-pay | Admitting: Family Medicine

## 2024-03-21 DIAGNOSIS — E669 Obesity, unspecified: Secondary | ICD-10-CM | POA: Insufficient documentation

## 2024-03-21 DIAGNOSIS — F411 Generalized anxiety disorder: Secondary | ICD-10-CM | POA: Insufficient documentation

## 2024-03-21 DIAGNOSIS — Z8759 Personal history of other complications of pregnancy, childbirth and the puerperium: Secondary | ICD-10-CM | POA: Insufficient documentation

## 2024-03-21 DIAGNOSIS — Z130 Encounter for screening for diseases of the blood and blood-forming organs and certain disorders involving the immune mechanism: Secondary | ICD-10-CM

## 2024-03-23 NOTE — Progress Notes (Signed)
 Hi Hiba, Your hemoglobin hematocrit and red blood cell indices are low.  At this time, I would recommend we also obtain an iron panel to evaluate your current iron levels.  In the past, have you ever been able to tolerate any specific sort of iron replacement supplement?  If you are agreeable to obtain additional blood work, please let me know

## 2024-03-30 ENCOUNTER — Encounter (HOSPITAL_BASED_OUTPATIENT_CLINIC_OR_DEPARTMENT_OTHER): Payer: Self-pay | Admitting: Family Medicine

## 2024-03-30 NOTE — Progress Notes (Signed)
 Marissa Hansen,  Please contact the office regarding your lab results. Thank you.

## 2024-04-14 ENCOUNTER — Other Ambulatory Visit (HOSPITAL_BASED_OUTPATIENT_CLINIC_OR_DEPARTMENT_OTHER): Payer: Self-pay | Admitting: Family Medicine

## 2024-04-26 NOTE — Telephone Encounter (Signed)
 Please see recent mychart sent by pt and advise.

## 2024-04-27 ENCOUNTER — Other Ambulatory Visit (HOSPITAL_BASED_OUTPATIENT_CLINIC_OR_DEPARTMENT_OTHER): Payer: Self-pay | Admitting: Family Medicine

## 2024-04-27 DIAGNOSIS — D509 Iron deficiency anemia, unspecified: Secondary | ICD-10-CM

## 2024-04-27 MED ORDER — ZEPBOUND 12.5 MG/0.5ML ~~LOC~~ SOAJ: 12.5000 mg | SUBCUTANEOUS | 2 refills | Status: DC

## 2024-04-27 MED ORDER — TIRZEPATIDE-WEIGHT MANAGEMENT 12.5 MG/0.5ML ~~LOC~~ SOLN: 12.5000 mg | SUBCUTANEOUS | 3 refills | Status: DC

## 2024-04-27 NOTE — Telephone Encounter (Signed)
 Please see new message sent by pt about zepbound .

## 2024-04-28 ENCOUNTER — Ambulatory Visit (HOSPITAL_BASED_OUTPATIENT_CLINIC_OR_DEPARTMENT_OTHER): Payer: Self-pay | Admitting: Certified Nurse Midwife

## 2024-04-28 LAB — CBC WITH DIFFERENTIAL/PLATELET
Basophils Absolute: 0.1 x10E3/uL (ref 0.0–0.2)
Basos: 2 %
EOS (ABSOLUTE): 0.5 x10E3/uL — ABNORMAL HIGH (ref 0.0–0.4)
Eos: 10 %
Hematocrit: 34.4 % (ref 34.0–46.6)
Hemoglobin: 9.5 g/dL — ABNORMAL LOW (ref 11.1–15.9)
Immature Grans (Abs): 0 x10E3/uL (ref 0.0–0.1)
Immature Granulocytes: 0 %
Lymphocytes Absolute: 1.9 x10E3/uL (ref 0.7–3.1)
Lymphs: 35 %
MCH: 20.1 pg — ABNORMAL LOW (ref 26.6–33.0)
MCHC: 27.6 g/dL — ABNORMAL LOW (ref 31.5–35.7)
MCV: 73 fL — ABNORMAL LOW (ref 79–97)
Monocytes Absolute: 0.2 x10E3/uL (ref 0.1–0.9)
Monocytes: 4 %
Neutrophils Absolute: 2.7 x10E3/uL (ref 1.4–7.0)
Neutrophils: 49 %
Platelets: 490 x10E3/uL — ABNORMAL HIGH (ref 150–450)
RBC: 4.72 x10E6/uL (ref 3.77–5.28)
RDW: 17.6 % — ABNORMAL HIGH (ref 11.7–15.4)
WBC: 5.5 x10E3/uL (ref 3.4–10.8)

## 2024-04-28 LAB — THYROID PANEL WITH TSH
Free Thyroxine Index: 2.1 (ref 1.2–4.9)
T3 Uptake Ratio: 28 % (ref 24–39)
T4, Total: 7.5 ug/dL (ref 4.5–12.0)
TSH: 0.65 u[IU]/mL (ref 0.450–4.500)

## 2024-04-28 LAB — BETA HCG QUANT (REF LAB): hCG Quant: <1 m[IU]/mL

## 2024-04-28 NOTE — Telephone Encounter (Signed)
 Pt sent new message about dose that was sent in. When stated to pt that we had 10mg  showing as prior dosage for the reason why the 12.5mg  was sent in, pt said that she never started the 10mg  as she still had some of the 7.5mg  that she was finishing up. Pharmacy is [facility].  Please advise.

## 2024-04-29 ENCOUNTER — Other Ambulatory Visit (HOSPITAL_BASED_OUTPATIENT_CLINIC_OR_DEPARTMENT_OTHER): Payer: Self-pay | Admitting: Family Medicine

## 2024-04-29 MED ORDER — TIRZEPATIDE-WEIGHT MANAGEMENT 10 MG/0.5ML ~~LOC~~ SOLN: 10.0000 mg | SUBCUTANEOUS | 3 refills | Status: DC

## 2024-06-28 ENCOUNTER — Other Ambulatory Visit (HOSPITAL_COMMUNITY): Payer: Self-pay

## 2024-07-04 ENCOUNTER — Encounter (HOSPITAL_BASED_OUTPATIENT_CLINIC_OR_DEPARTMENT_OTHER): Payer: Self-pay | Admitting: Family Medicine

## 2024-07-09 ENCOUNTER — Other Ambulatory Visit (HOSPITAL_BASED_OUTPATIENT_CLINIC_OR_DEPARTMENT_OTHER): Payer: Self-pay

## 2024-07-09 ENCOUNTER — Telehealth (HOSPITAL_BASED_OUTPATIENT_CLINIC_OR_DEPARTMENT_OTHER): Payer: Self-pay

## 2024-07-09 DIAGNOSIS — E669 Obesity, unspecified: Secondary | ICD-10-CM

## 2024-07-09 MED ORDER — TIRZEPATIDE-WEIGHT MANAGEMENT 10 MG/0.5ML ~~LOC~~ SOLN: 10.0000 mg | SUBCUTANEOUS | 1 refills | Status: AC

## 2024-07-09 NOTE — Telephone Encounter (Signed)
 Spoke to pt concerning medications for zepbound  and wellbutrin  as advised by Dr. De [place] (see previous mychart message). Appt made for 07/29/2024 at 2:50pm. Pt needed zepbound  resent. Rx refilled to [facility] as requested per pt.

## 2024-07-29 ENCOUNTER — Ambulatory Visit (HOSPITAL_BASED_OUTPATIENT_CLINIC_OR_DEPARTMENT_OTHER): Admitting: Family Medicine

## 2024-07-31 ENCOUNTER — Encounter (HOSPITAL_BASED_OUTPATIENT_CLINIC_OR_DEPARTMENT_OTHER): Payer: Self-pay | Admitting: Family Medicine

## 2024-08-02 NOTE — Telephone Encounter (Signed)
 Please see mychart message sent by pt and advise.

## 2024-08-15 ENCOUNTER — Encounter (HOSPITAL_BASED_OUTPATIENT_CLINIC_OR_DEPARTMENT_OTHER): Payer: Self-pay | Admitting: *Deleted

## 2024-08-16 ENCOUNTER — Encounter (HOSPITAL_BASED_OUTPATIENT_CLINIC_OR_DEPARTMENT_OTHER): Admitting: Family Medicine

## 2024-08-18 ENCOUNTER — Encounter (HOSPITAL_BASED_OUTPATIENT_CLINIC_OR_DEPARTMENT_OTHER): Admitting: Family Medicine

## 2024-09-14 ENCOUNTER — Ambulatory Visit (HOSPITAL_BASED_OUTPATIENT_CLINIC_OR_DEPARTMENT_OTHER): Payer: 59 | Admitting: Certified Nurse Midwife
